# Patient Record
Sex: Female | Born: 1975 | Race: Black or African American | Hispanic: No | Smoking: Current every day smoker
Health system: Southern US, Community
[De-identification: ages and names within clinical notes are randomized; demographics above are authoritative.]

## PROBLEM LIST (undated history)

## (undated) DIAGNOSIS — I10 Essential (primary) hypertension: Secondary | ICD-10-CM

## (undated) HISTORY — PX: WISDOM TOOTH EXTRACTION: SHX21

---

## 2010-06-29 DIAGNOSIS — I10 Essential (primary) hypertension: Secondary | ICD-10-CM

## 2010-06-29 HISTORY — DX: Essential (primary) hypertension: I10

## 2015-12-13 ENCOUNTER — Other Ambulatory Visit: Payer: Self-pay | Admitting: Obstetrics and Gynecology

## 2015-12-13 DIAGNOSIS — Z1231 Encounter for screening mammogram for malignant neoplasm of breast: Secondary | ICD-10-CM

## 2015-12-26 ENCOUNTER — Ambulatory Visit (HOSPITAL_COMMUNITY)
Admission: RE | Admit: 2015-12-26 | Discharge: 2015-12-26 | Disposition: A | Discharge status: Home / Self Care | Payer: No Typology Code available for payment source | Source: Ambulatory Visit | Attending: Obstetrics and Gynecology | Admitting: Obstetrics and Gynecology

## 2015-12-26 ENCOUNTER — Encounter (HOSPITAL_COMMUNITY): Payer: Self-pay

## 2015-12-26 ENCOUNTER — Ambulatory Visit
Admission: RE | Admit: 2015-12-26 | Discharge: 2015-12-26 | Disposition: A | Discharge status: Home / Self Care | Payer: No Typology Code available for payment source | Source: Ambulatory Visit | Attending: Obstetrics and Gynecology | Admitting: Obstetrics and Gynecology

## 2015-12-26 VITALS — BP 142/96 | Temp 98.7°F | Ht 65.0 in | Wt 280.8 lb

## 2015-12-26 DIAGNOSIS — Z1231 Encounter for screening mammogram for malignant neoplasm of breast: Secondary | ICD-10-CM

## 2015-12-26 DIAGNOSIS — Z01419 Encounter for gynecological examination (general) (routine) without abnormal findings: Secondary | ICD-10-CM

## 2015-12-26 HISTORY — DX: Essential (primary) hypertension: I10
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
## 2015-12-26 NOTE — Patient Instructions (Signed)
Educational materials on self breast awareness given. Explained to Janet Wise that [facility] will cover Pap smears every 3 years unless has a history of abnormal Pap smears. [facility] mamm[facility]. Appointment scheduled for Thursday, December 26, 2015 at 1120. Let patient know will follow up with her within the next couple weeks with results of Pap smear by phone. Let patient know that the [facility] will follow up with her on results to mammogram by letter or phone. Janet DunningsJamie Harris verbalized understanding.  [place], Kathaleen Maserhristine Poll, RN 1:46 PM

## 2015-12-27 LAB — CYTOLOGY - PAP

## 2015-12-30 ENCOUNTER — Encounter (HOSPITAL_COMMUNITY): Payer: Self-pay | Admitting: *Deleted

## 2015-12-30 ENCOUNTER — Telehealth (HOSPITAL_COMMUNITY): Payer: Self-pay | Admitting: *Deleted

## 2015-12-30 NOTE — Telephone Encounter (Signed)
Telephoned patient at home # and discussed negative pap smear results. HPV was negative. Transformation zone absent due to this next pap smear due in 3 years. Patient voiced understanding.

## 2016-02-03 ENCOUNTER — Telehealth: Payer: Self-pay

## 2016-02-03 NOTE — Telephone Encounter (Signed)
Called and informed about WISEWOMAN Program. Patient stated that she was interested and scheduled her for 02/06/16 at 9:30 AM

## 2016-02-05 ENCOUNTER — Telehealth: Payer: Self-pay

## 2016-02-05 NOTE — Telephone Encounter (Signed)
Patient was called to remind of WISEWOMAN appointment at 9:30 and to come fasting. Patient stated that would be there and did not need my phone number.

## 2016-02-06 ENCOUNTER — Other Ambulatory Visit: Payer: Self-pay

## 2016-02-06 ENCOUNTER — Ambulatory Visit: Payer: Self-pay

## 2016-02-06 VITALS — BP 146/102 | HR 72 | Temp 98.2°F | Resp 16 | Ht 62.6 in | Wt 278.7 lb

## 2016-02-06 DIAGNOSIS — Z0000 Encounter for general adult medical examination without abnormal findings: Secondary | ICD-10-CM

## 2016-02-06 NOTE — Progress Notes (Signed)
[facility] and is currently a [facility] patient effective 12/26/2015.   Clinical Measurements: Patient is 5 ft. 2.6 inches, weight 278.7 lbs, and BMI 50.1.   Medical History: Patient has no history of high cholesterol or diabetes. Patient does have a history of hypertension. Patient stated that was on medication in blue box believe it was lisinopril/HCTZ. Per patient no diagnosed history of coronary heart disease, heart attack, heart failure, stroke/TIA, vascular disease or congenital heart defects.  Blood Pressure, Self-measurement: Patient states does not check Blood pressure. Explained with her history that should check blood pressure at least weekly.  Nutrition Assessment: Patient stated that eats 1 fruit a week. Patient states she eats 5 servings of vegetables a week. Per patient does not eat 3 or more ounces of whole grains daily. Patient does eat two or more servings of fish weekly. Patient states she does not drink more than 36 ounces or 450 calories of beverages with added sugars weekly. Patient states she drinks a lot of water. Patient stated she does watch her salt intake.   Physical Activity Assessment: Patient stated she moves around a lot cleaning and walking in a day. Patient stated has probably been doing 2400 minutes of moderate exercise a week and no vigorous exercise.  Smoking Status: Patient stated that had quit smoking for three years and started back. Patient states smokes 1/2 pack newports per day and is not exposed to smoke.  Quality of Life Assessment: In assessing patient's physical quality of life she stated that out of the past 30 days that she has felt her health was good all days. Patient also stated that in the past 30 days that her mental health was good including stress, depression and problems with emotions for all days. Patient did state that out of the past 30 days she felt her physical or mental health had not kept her from doing  her usual activities including self-care, work or recreation.  Plan: Lab work will be done today including a lipid panel and Hgb A1C. Will call lab results when they are finished. Patient will receive Health Coaching for risk reduction initially and then as patient wants.  FIRST HEALTH COACH: During initial screening  wellness health coaching was started.  Blood Pressure: Blood pressure readings were 160/108 and 146/102. Readings were in the hypertensive area. Informed patient that I would be getting her an appointment in the next week. I found out what time was better for appointment and will have patient get paper work for doctor's visit. Discussed non changeable and changeable risk factors with patient. Discussed changeable risk factors specific to patient. These included overweight, smoking and stress.  Nutrition: Discussed with patient that normally should eat 2 servings of fruits and 2 to 21/2 of vegetables a day with a serving being 1/2 cup or small piece. Informed patient that two servings of fish were recommended per week, being ones with omega threes. Listed the fishes with omega three's for patient.Discussed that should have 5 to 7 ounces of fiber a day. Explained to patient that should eat fruits and vegetables high in fiber but 3 of her 5 to 7 grains of fiber should be whole grain. Examples of whole grains were given. Informed about sugar which patient is doing. Discussed decreasing salt intake by mainly adding some while cook and none at table.  Physical Activity:  Patient was Informed that minimal moderate activity is 150 minutes a week or 75 minutes of  vigorous. Patient is doing her minimal moderate exercise. Discussed adding in some flexeibily and balance exercises.  Patient and I talked about that patients intake of nutrients may not be sufficient.  PLAN: Will set goal when call lab results and do second health coaching. Will think about what goals may want to set.

## 2016-02-06 NOTE — Patient Instructions (Signed)
Discussed health assessment with patient. Informed patient that she would need to be followed up for blood pressure. She will be called with results of lab work and we will then discussed any further follow up the patient needs. Patient will implement behavior modifications to help lower BP. Patient verbalized understanding. 

## 2016-02-07 ENCOUNTER — Telehealth: Payer: Self-pay

## 2016-02-07 LAB — LIPID PANEL
CHOLESTEROL TOTAL: 155 mg/dL (ref 100–199)
Chol/HDL Ratio: 3.2 ratio units (ref 0.0–4.4)
HDL: 48 mg/dL (ref >39)
LDL CALC: 93 mg/dL (ref 0–99)
TRIGLYCERIDES: 70 mg/dL (ref 0–149)
VLDL CHOLESTEROL CAL: 14 mg/dL (ref 5–40)

## 2016-02-07 LAB — HEMOGLOBIN A1C
ESTIMATED AVERAGE GLUCOSE: 91 mg/dL
Hemoglobin A1c: 4.8 % (ref 4.8–5.6)

## 2016-02-07 NOTE — Telephone Encounter (Addendum)
LAB RESULTS: Patient was called to inform about lab work from 02/06/16. I informed patient: cholesterol- 155, HDL- 48, LDL- 93, triglycerides - 70,  HBG-A1C - 4.8, and BMI 50.1.  DOCTOR's APPOINTMENT: [facility], August [facility] at 10:15 AM for elevated blood Pressure. Facility address and telephone number given. Patient was informed not to let doctor do any other lab work or injections. WISEWOMAN does not cover and patient would be billed.   HEALTH COACHING: Did risk reduction counseling/Heach Coach concerning related to BMI/Weight and blood pressure. Patient and I discussed her eating. Patient  Stated that does not eat breakfast or eat a lot. Patient asked about juicing and we discussed pros and con's.  Discussed serving sizes and reminded her of her WISEWOMAN overview sheet that received at screening. WISEWOMAN sheet has all food group minimal servings required for day for1600 calorie diet.  Some examples of meal were given. Discussed 3 regular meals or 6 small meals a day.   NAVIGATION NEEDS ASSESSMENT AND CARE PLAN: Patient does need additional social support and has a lack of access to services. Patient has to get transportation to get to appointments. Patient understands why she has to be followed up for blood pressure. Only barrier may have been that patient is not aware of some services to assist the low income. Patient has been directed to [facility] for [facility].  PLAN: Patient will attend doctor's appointment. Will come by and pick up packets for doctors office. Will call patient back after appointment to see how appointment went. Patient and I will discuss more health coaching concerning patients exercise and weight loss goals

## 2016-02-10 NOTE — Progress Notes (Signed)
A packet was left at front desk for patient for her up coming doctors appointment on Friday. Patient stated that would pick up Monday before leaves town.

## 2016-02-14 ENCOUNTER — Ambulatory Visit (INDEPENDENT_AMBULATORY_CARE_PROVIDER_SITE_OTHER): Payer: Self-pay | Admitting: Family Medicine

## 2016-02-14 DIAGNOSIS — Z716 Tobacco abuse counseling: Secondary | ICD-10-CM

## 2016-02-14 DIAGNOSIS — I10 Essential (primary) hypertension: Secondary | ICD-10-CM

## 2016-02-14 NOTE — Progress Notes (Signed)
Subjective:    Patient ID: Janet Wise , female   DOB: 08/23/1975 , 40 y.o..   MRN: [id number]  HPI  Janet DunningsJamie Wise [facility] and to establish care at the clinic.  Hypertension Blood pressure at home: Does not check, but was told she had high blood pressure in the past. Exercise: Cleans and walks throughout the day but no structured exercise regimen in place Low salt diet: not adherent  Medications: None currently. Has previously taken lisinopril and HCTZ ROS: Denies headache, dizziness, visual changes, nausea, vomiting, chest pain, abdominal pain or shortness of breath. BP Readings from Last 3 Encounters:  02/14/16 140/80  02/06/16 (!) 146/102  12/26/15 (!) 142/96    Tobacco Abuse:  She  has been smoking ~12 cigarettes per day off and on since she was 15. There has been multiple attempts to quit and a couple successes. Previous strategies tried were "cold [place]key". She is ready to quit in the next couple days.   Review of Systems: Per HPI. All other systems reviewed and are negative.  Health Maintenance:  Mammogram in June 2017- normal  Pap smear in June 2017- normal   Past Medical History:  Diagnosis Date  . Hypertension 2012   Medications:None  Social Hx: Smokes cigarettes. Smokes 1/2 PPD. Has been smoking off and on since she was 40 years old Lives with her 42 year old son She is currently unemployed    Objective:   BP 140/80   Pulse 70   Temp 98.7 F (37.1 C) (Oral)   Ht 5\' 3"  (1.6 m)   Wt 276 lb 12.8 oz (125.6 kg)   LMP 02/14/2016   SpO2 99%   BMI 49.03 kg/m  Physical Exam  Gen: Pleasant, NAD, alert, cooperative with exam, well-appearing, obese HEENT: NCAT, PERRL, clear conjunctiva, oropharynx clear, supple neck  Cardiac: Regular rate and rhythm, normal S1/S2, no murmur, no edema, capillary refill brisk  Respiratory: Clear to auscultation bilaterally, no wheezes, non-labored Gastrointestinal: soft, non tender, non distended,  bowel sounds present Skin: no rashes, normal turgor  Neurological: no gross deficits.  Psych: good insight, normal mood and affect   Assessment & Plan:  Essential hypertension Goal BP for patient is less than 140/90 according to JNC-8 guidelines. Suspect she has high BP due to obesity, tobacco abuse, and current diet. BP initially 149/96 in beginning of the office visit, patient admitted she was a little nervoous. When rechecked manually later it was 140/80. Patient very well informed about risks of hypertension and was agreeable to start a medication if needed. Patient does not have any financial coverage for lab work at this time, she would like to first obtain financial assistance before getting any lab work done. The plan is for her to meet with financial counselor next week and have her come back afterwards and recheck her BP and get a CMP. If her BP is still above goal and her kidney function and electrolytes normal, may start HCTZ.   Tobacco abuse counseling Smoking cessation instruction/counseling given:  counseled patient on the dangers of tobacco use, advised patient to stop smoking, and reviewed strategies to maximize success. Discussed medications that could assist with smoking cessation. Patient agreeable to try Nicotine patch. Will start with 14mg  patch as she is currently smoking a 1/2 PPD. I suspect that if she is able to quit smoking, her BP may also reach its goal.    Anders Simmondshristina Gambino, MD [facility], PGY-2

## 2016-02-14 NOTE — Patient Instructions (Signed)
Thank you for coming in today, it was so nice to see you! Today we talked about:    Blood pressure: Your blood pressure was slightly high today. We will wait for you to have coverage for your visit to do lab work before we start any medications. Your blood pressure may go down on its own after you quit smoking and we will not need any medications. Please stop by the front desk and ask for instruction on seeing [facility] for financial assistance.   Cigarette smoking: I have prescribed nicotine patches, they will be sent to your pharmacy. Please use after you quit smoking. If you need extra help 1800 QUIT NOW for free resources.  Please follow up in 2-3 weeks. You can schedule this appointment at the front desk before you leave or call the clinic.  If you have any questions or concerns, please do not hesitate to call the office at [phone]. You can also message me directly via MyChart.   Sincerely,  Anders Simmondshristina Kimmy Parish, MD

## 2016-02-16 ENCOUNTER — Encounter: Payer: Self-pay | Admitting: Family Medicine

## 2016-02-16 DIAGNOSIS — I10 Essential (primary) hypertension: Secondary | ICD-10-CM | POA: Insufficient documentation

## 2016-02-16 DIAGNOSIS — Z716 Tobacco abuse counseling: Secondary | ICD-10-CM | POA: Insufficient documentation

## 2016-02-16 MED ORDER — NICOTINE 14 MG/24HR TD PT24: 14.0000 mg | MEDICATED_PATCH | Freq: Every day | TRANSDERMAL | 4 refills | Status: DC

## 2016-02-16 NOTE — Assessment & Plan Note (Addendum)
Smoking cessation instruction/counseling given:  counseled patient on the dangers of tobacco use, advised patient to stop smoking, and reviewed strategies to maximize success. Discussed medications that could assist with smoking cessation. Patient agreeable to try Nicotine patch. Will start with 14mg  patch as she is currently smoking a 1/2 PPD. I suspect that if she is able to quit smoking, her BP may also reach its goal.

## 2016-02-16 NOTE — Assessment & Plan Note (Addendum)
Goal BP for patient is less than 140/90 according to JNC-8 guidelines. Suspect she has high BP due to obesity, tobacco abuse, and current diet. BP initially 149/96 in beginning of the office visit, patient admitted she was a little nervoous. When rechecked manually later it was 140/80. Patient very well informed about risks of hypertension and was agreeable to start a medication if needed. Patient does not have any financial coverage for lab work at this time, she would like to first obtain financial assistance before getting any lab work done. The plan is for her to meet with financial counselor next week and have her come back afterwards and recheck her BP and get a CMP. If her BP is still above goal and her kidney function and electrolytes normal, may start HCTZ.

## 2016-02-19 ENCOUNTER — Ambulatory Visit: Payer: No Typology Code available for payment source

## 2016-02-26 ENCOUNTER — Ambulatory Visit: Payer: Self-pay | Attending: Internal Medicine

## 2016-03-18 ENCOUNTER — Telehealth: Payer: Self-pay | Admitting: Family Medicine

## 2016-03-18 ENCOUNTER — Ambulatory Visit (INDEPENDENT_AMBULATORY_CARE_PROVIDER_SITE_OTHER): Payer: Self-pay | Admitting: Family Medicine

## 2016-03-18 VITALS — BP 147/93 | HR 74 | Temp 98.7°F | Ht 65.0 in | Wt 279.8 lb

## 2016-03-18 DIAGNOSIS — Z0000 Encounter for general adult medical examination without abnormal findings: Secondary | ICD-10-CM

## 2016-03-18 DIAGNOSIS — N938 Other specified abnormal uterine and vaginal bleeding: Secondary | ICD-10-CM

## 2016-03-18 DIAGNOSIS — I10 Essential (primary) hypertension: Secondary | ICD-10-CM

## 2016-03-18 DIAGNOSIS — Z716 Tobacco abuse counseling: Secondary | ICD-10-CM

## 2016-03-18 MED ORDER — HYDROCHLOROTHIAZIDE 12.5 MG PO TABS: 12.5000 mg | ORAL_TABLET | Freq: Every day | ORAL | 3 refills | Status: DC

## 2016-03-18 NOTE — Assessment & Plan Note (Signed)
Smoking about 1 pack per day, sometimes a half pack per day. Establish quit date of 06/29/2016. Patient not interested in any nicotine replacement at this time.

## 2016-03-18 NOTE — Assessment & Plan Note (Addendum)
Symptoms could likely be from fibroids. Will need to rule out more serious causes including malignancy. Pap smear reassuringly normal in June 2017. Also concerned that patient may be anemic based on the amount of blood that she loses every month. Patient not a candidate for oral contraceptives because she is a smoker over the age of 42, which increases her risk of blood clots. -Ordered CBC, TSH, pelvic and transvaginal ultrasound today -May consider IUD to decrease menstrual bleeding if pelvic ultrasounds normal -May consider referral to gynecology in the future

## 2016-03-18 NOTE — Patient Instructions (Signed)
Thank you for coming in today, it was so nice to see you! Today we talked about:    Blood pressure: We will start you on hydrochlorothiazide. Take 1 pill a day. Try to check your blood pressure at least 3 times a week. The top number should be less than 140 and the bottom number should be less than 90.   Smoking: Your quit date is Jun 29, 2016 :) . Call 1800 QUIT NOW for more assistance on smoking cessation  Heavy menstrual bleeding: We will check your blood today and I have ordered an ultrasound  Please follow up in 3 months. You can schedule this appointment at the front desk before you leave or call the clinic.  Bring in all your medications or supplements to each appointment for review.   If we ordered any tests today, you will be notified via telephone of any abnormalities. If everything is normal you will get a letter in the mail.   If you have any questions or concerns, please do not hesitate to call the office at [phone]. You can also message me directly via MyChart.   Sincerely,  Anders Simmondshristina Athalene Kolle, MD   Hypertension Hypertension, commonly called high blood pressure, is when the force of blood pumping through your arteries is too strong. Your arteries are the blood vessels that carry blood from your heart throughout your body. A blood pressure reading consists of a higher number over a lower number, such as 110/72. The higher number (systolic) is the pressure inside your arteries when your heart pumps. The lower number (diastolic) is the pressure inside your arteries when your heart relaxes. Ideally you want your blood pressure below 120/80. Hypertension forces your heart to work harder to pump blood. Your arteries may become narrow or stiff. Having untreated or uncontrolled hypertension can cause heart attack, stroke, kidney disease, and other problems. RISK FACTORS Some risk factors for high blood pressure are controllable. Others are not.  Risk factors you cannot control  include:   Race. You may be at higher risk if you are African American.  Age. Risk increases with age.  Gender. Men are at higher risk than women before age 42 years. After age 42, women are at higher risk than men. Risk factors you can control include:  Not getting enough exercise or physical activity.  Being overweight.  Getting too much fat, sugar, calories, or salt in your diet.  Drinking too much alcohol. SIGNS AND SYMPTOMS Hypertension does not usually cause signs or symptoms. Extremely high blood pressure (hypertensive crisis) may cause headache, anxiety, shortness of breath, and nosebleed. DIAGNOSIS To check if you have hypertension, your health care provider will measure your blood pressure while you are seated, with your arm held at the level of your heart. It should be measured at least twice using the same arm. Certain conditions can cause a difference in blood pressure between your right and left arms. A blood pressure reading that is higher than normal on one occasion does not mean that you need treatment. If it is not clear whether you have high blood pressure, you may be asked to return on a different day to have your blood pressure checked again. Or, you may be asked to monitor your blood pressure at home for 1 or more weeks. TREATMENT Treating high blood pressure includes making lifestyle changes and possibly taking medicine. Living a healthy lifestyle can help lower high blood pressure. You may need to change some of your habits. Lifestyle changes  may include:  Following the DASH diet. This diet is high in fruits, vegetables, and whole grains. It is low in salt, red meat, and added sugars.  Keep your sodium intake below 2,300 mg per day.  Getting at least 30-45 minutes of aerobic exercise at least 4 times per week.  Losing weight if necessary.  Not smoking.  Limiting alcoholic beverages.  Learning ways to reduce stress. Your health care provider may prescribe  medicine if lifestyle changes are not enough to get your blood pressure under control, and if one of the following is true:  You are 61-80 years of age and your systolic blood pressure is above 140.  You are 2 years of age or older, and your systolic blood pressure is above 150.  Your diastolic blood pressure is above 90.  You have diabetes, and your systolic blood pressure is over 140 or your diastolic blood pressure is over 90.  You have kidney disease and your blood pressure is above 140/90.  You have heart disease and your blood pressure is above 140/90. Your personal target blood pressure may vary depending on your medical conditions, your age, and other factors. HOME CARE INSTRUCTIONS  Have your blood pressure rechecked as directed by your health care provider.   Take medicines only as directed by your health care provider. Follow the directions carefully. Blood pressure medicines must be taken as prescribed. The medicine does not work as well when you skip doses. Skipping doses also puts you at risk for problems.  Do not smoke.   Monitor your blood pressure at home as directed by your health care provider. SEEK MEDICAL CARE IF:   You think you are having a reaction to medicines taken.  You have recurrent headaches or feel dizzy.  You have swelling in your ankles.  You have trouble with your vision. SEEK IMMEDIATE MEDICAL CARE IF:  You develop a severe headache or confusion.  You have unusual weakness, numbness, or feel faint.  You have severe chest or abdominal pain.  You vomit repeatedly.  You have trouble breathing. MAKE SURE YOU:   Understand these instructions.  Will watch your condition.  Will get help right away if you are not doing well or get worse.   This information is not intended to replace advice given to you by your health care provider. Make sure you discuss any questions you have with your health care provider.   Document Released:  06/15/2005 Document Revised: 10/30/2014 Document Reviewed: 04/07/2013 Elsevier Interactive Patient Education [employer]

## 2016-03-18 NOTE — Telephone Encounter (Signed)
Pt called and said that her BO medication has to be faxed to the [facility]. jw

## 2016-03-18 NOTE — Telephone Encounter (Signed)
Called to confirm that patient had gotten her appointment time for her Ultrasound on 27 Sept 2017 at [facility] 0900 hrs with a show time of 0845.  She was instructed to arrive with a full bladder.Glennie HawkSimpson, Shamra Bradeen R

## 2016-03-18 NOTE — Assessment & Plan Note (Signed)
Uncontrolled. Patient has not checked her blood pressure at home but suspect that she lives in the 140s systolically and 90s diastolically. -Obtain CMP today -We will start hydrochlorothiazide 12.5 mg daily -Encouraged smoking cessation and weight loss

## 2016-03-18 NOTE — Assessment & Plan Note (Signed)
Obtained HIV testing today. Patient not interested in flu shot.

## 2016-03-18 NOTE — Progress Notes (Signed)
Subjective:    Patient ID: Janet DunningsJamie Wise , female   DOB: 07/30/1975 , 40 y.o..   MRN: [id number]  HPI  Janet DunningsJamie Wise is here for  Chief Complaint  Patient presents with  . Follow-up    blood pressure  . painful menses    large clots   .  Hypertension Blood pressure at home: Does not check Exercise: None Low salt diet: No Medications: No medications currently ROS: Denies headache, dizziness, visual changes, nausea, vomiting, chest pain, abdominal pain or shortness of breath. BP Readings from Last 3 Encounters:  03/18/16 (!) 147/93  02/14/16 140/80  02/06/16 (!) 146/102    Tobacco Abuse:  Patient continues to smoke about 1 PPD, sometimes she will only smoke 10 cigarettes a day. She states the Nicotine patches are just not for her. She does not want to try the lozenges or nicotine gum either. She thinks that the only way that she is going to quit is that she just quit cold [place]. Discussed a quit date and she decided on 06/29/2016.  Large Blood clots and painful menses:  Patient states that she has had large blood clots with her menstrual cycle for the last couple years. She gets her menstrual cycle once a month and it usually lasts about 5 days. During her heaviest days she will pass large clots and can use up to 10 tampons a day. Denies any history of anemia, fatigue, lightheadedness. Admits that she does like to eat a lot of ice.  Review of Systems: Per HPI. All other systems reviewed and are negative.  Past Medical History: Patient Active Problem List   Diagnosis Date Noted  . Dysfunctional uterine bleeding 03/18/2016  . Healthcare maintenance 03/18/2016  . Essential hypertension 02/16/2016  . Tobacco abuse counseling 02/16/2016    Medications: None  Social Hx:  reports that she has been smoking Cigarettes.  She has a 1.00 pack-year smoking history. She has never used smokeless tobacco.    Objective:   BP (!) 147/93 (BP Location: Left Arm, Patient Position:  Sitting, Cuff Size: Large)   Pulse 74   Temp 98.7 F (37.1 C) (Oral)   Ht 5\' 5"  (1.651 m)   Wt 279 lb 12.8 oz (126.9 kg)   SpO2 98%   BMI 46.56 kg/m  Physical Exam  Gen: NAD, alert, cooperative with exam, well-appearing, obese HEENT: NCAT, PERRL, clear conjunctiva, oropharynx clear, supple neck Cardiac: Regular rate and rhythm, normal S1/S2, no murmur, no edema, capillary refill brisk  Respiratory: Clear to auscultation bilaterally, no wheezes, non-labored breathing Gastrointestinal: soft, non tender, non distended, bowel sounds present Skin: no rashes, normal turgor  Neurological: no gross deficits.  Psych: good insight, normal mood and affect   Assessment & Plan:  Dysfunctional uterine bleeding Symptoms could likely be from fibroids. Will need to rule out more serious causes including malignancy. Pap smear reassuringly normal in June 2017. Also concerned that patient may be anemic based on the amount of blood that she loses every month. Patient not a candidate for oral contraceptives because she is a smoker over the age of 935, which increases her risk of blood clots. -Ordered CBC, TSH, pelvic and transvaginal ultrasound today -May consider IUD to decrease menstrual bleeding if pelvic ultrasounds normal -May consider referral to gynecology in the future   Essential hypertension Uncontrolled. Patient has not checked her blood pressure at home but suspect that she lives in the 140s systolically and 90s diastolically. -Obtain CMP today -We will start hydrochlorothiazide 12.5  mg daily -Encouraged smoking cessation and weight loss  Tobacco abuse counseling Smoking about 1 pack per day, sometimes a half pack per day. Establish quit date of 06/29/2016. Patient not interested in any nicotine replacement at this time.  Healthcare maintenance Obtained HIV testing today. Patient not interested in flu shot.   Anders Simmonds, MD [facility], PGY-2

## 2016-03-19 ENCOUNTER — Telehealth: Payer: Self-pay | Admitting: Family Medicine

## 2016-03-19 DIAGNOSIS — R7989 Other specified abnormal findings of blood chemistry: Secondary | ICD-10-CM

## 2016-03-19 LAB — CBC
HEMATOCRIT: 34.8 % — AB (ref 35.0–45.0)
HEMOGLOBIN: 10.3 g/dL — AB (ref 11.7–15.5)
MCH: 24.9 pg — ABNORMAL LOW (ref 27.0–33.0)
MCHC: 29.6 g/dL — ABNORMAL LOW (ref 32.0–36.0)
MCV: 84.1 fL (ref 80.0–100.0)
MPV: 8.6 fL (ref 7.5–12.5)
Platelets: 395 10*3/uL (ref 140–400)
RBC: 4.14 MIL/uL (ref 3.80–5.10)
RDW: 18.8 % — ABNORMAL HIGH (ref 11.0–15.0)
WBC: 7.1 10*3/uL (ref 3.8–10.8)

## 2016-03-19 LAB — COMPLETE METABOLIC PANEL WITH GFR
ALBUMIN: 3.8 g/dL (ref 3.6–5.1)
ALT: 6 U/L (ref 6–29)
AST: 11 U/L (ref 10–30)
Alkaline Phosphatase: 59 U/L (ref 33–115)
BUN: 8 mg/dL (ref 7–25)
CALCIUM: 9.1 mg/dL (ref 8.6–10.2)
CHLORIDE: 102 mmol/L (ref 98–110)
CO2: 25 mmol/L (ref 20–31)
Creat: 0.78 mg/dL (ref 0.50–1.10)
GFR, Est African American: >89 mL/min (ref >=60)
GFR, Est Non African American: >89 mL/min (ref >=60)
Glucose, Bld: 92 mg/dL (ref 65–99)
POTASSIUM: 4.3 mmol/L (ref 3.5–5.3)
Sodium: 135 mmol/L (ref 135–146)
Total Bilirubin: 0.2 mg/dL (ref 0.2–1.2)
Total Protein: 7.5 g/dL (ref 6.1–8.1)

## 2016-03-19 LAB — HIV ANTIBODY (ROUTINE TESTING W REFLEX): HIV: NONREACTIVE

## 2016-03-19 LAB — TSH: TSH: 0.37 mIU/L — ABNORMAL LOW

## 2016-03-19 NOTE — Telephone Encounter (Signed)
Called patient to discuss her lab work. Her TSH was slightly low at 0.37 pointing to possible hyperthyroidism. Patient has no hyperthyroidism symptoms, we will repeat the TSH and well as T4. Discussed with patient that I will place an order for the lab and she can come in anytime, just call the clinic 24 hours in advance. Also discussed low hemoglobin at 10.3 which was expected with patient's heavy menstrual cycles. Advised taking daily iron supplement and will follow up after pelvic and transvaginal ultrasounds.

## 2016-03-23 ENCOUNTER — Telehealth: Payer: Self-pay | Admitting: Family Medicine

## 2016-03-23 MED ORDER — HYDROCHLOROTHIAZIDE 12.5 MG PO TABS: 12.5000 mg | ORAL_TABLET | Freq: Every day | ORAL | 3 refills | Status: DC

## 2016-03-23 NOTE — Telephone Encounter (Signed)
Pt stated a Rx was supposed to called in to the health department last Wednesday. Pt went to pick it up today and it wasn't there. Pt would like to be called after it has been called in so pt knows its there. Please advise. Thanks! ep

## 2016-03-23 NOTE — Telephone Encounter (Signed)
Left message on [facility] line for HCTZ 12.5 mg and also electronically faxed to health department.  Patient is aware.  Clovis PuMartin, Fahd Galea L, RN

## 2016-03-25 ENCOUNTER — Ambulatory Visit (HOSPITAL_COMMUNITY)
Admission: RE | Admit: 2016-03-25 | Discharge: 2016-03-25 | Disposition: A | Discharge status: Home / Self Care | Payer: Self-pay | Source: Ambulatory Visit | Attending: Family Medicine | Admitting: Family Medicine

## 2016-03-25 ENCOUNTER — Other Ambulatory Visit: Payer: Self-pay

## 2016-03-25 DIAGNOSIS — R938 Abnormal findings on diagnostic imaging of other specified body structures: Secondary | ICD-10-CM | POA: Insufficient documentation

## 2016-03-25 DIAGNOSIS — R7989 Other specified abnormal findings of blood chemistry: Secondary | ICD-10-CM

## 2016-03-25 DIAGNOSIS — N938 Other specified abnormal uterine and vaginal bleeding: Secondary | ICD-10-CM | POA: Insufficient documentation

## 2016-03-25 LAB — T4, FREE: FREE T4: 0.9 ng/dL (ref 0.8–1.8)

## 2016-03-25 LAB — TSH: TSH: 0.67 m[IU]/L

## 2016-04-02 ENCOUNTER — Telehealth: Payer: Self-pay | Admitting: Family Medicine

## 2016-04-02 DIAGNOSIS — N938 Other specified abnormal uterine and vaginal bleeding: Secondary | ICD-10-CM

## 2016-04-02 DIAGNOSIS — Z0120 Encounter for dental examination and cleaning without abnormal findings: Secondary | ICD-10-CM

## 2016-04-02 NOTE — Telephone Encounter (Signed)
[facility] She has an orange card that expires 05-28-16. She would like the results from her blood work and ultrasound.

## 2016-04-02 NOTE — Telephone Encounter (Signed)
Pt states the pharmacy did not have the correct dosage of medication, so they gave pt a higher dose and told her to take half. Pt is experiencing a lot of vaginal itching and believes it is being caused by the medication. Please advise. Thanks! ep

## 2016-04-06 NOTE — Telephone Encounter (Signed)
Called patient back. She has been taking HCTZ and states it is making her vagina itch. She hasn't taken them in the last couple days. Her vagina is still itching however. Discussed with patient that this medication typically does not cause those symptoms. I advised her to make an appointment with us to discuss vaginal itching. Patient will look at her [place] and[place] call back during business hours. I advised her to please resume her HCTZ for blood pressure  I also discussed patient's vaginal U/S results. I believe she would benefit from seeing gynecology, will place a referral.   Additionally, patient states that [facility] needs [facility]. Unclear how to place a referral to the dentist. Will send a message to our CSW Gavin PoundDeborah for advice on this.   Anders Simmondshristina Mithcell Schumpert, MD [facility], PGY-2

## 2016-04-13 NOTE — Addendum Note (Signed)
Addended by: Beaulah DinningGAMBINO, Dani Danis M on: 04/13/2016 01:00 PM   Modules accepted: Orders

## 2016-06-04 ENCOUNTER — Encounter: Payer: Self-pay | Admitting: Family

## 2016-06-09 ENCOUNTER — Ambulatory Visit: Payer: Self-pay | Admitting: Family Medicine

## 2016-08-17 ENCOUNTER — Telehealth: Payer: Self-pay

## 2016-09-18 NOTE — Telephone Encounter (Signed)
Health Coaching # 3  Initial Screening: 02/06/16  Goal: Patients goal was to make sure she was getting her calorie intake in. She states she has started eating breakfast recently, she she did not before. She will work on eating lunch and does eat dinner. She will work on portion control and getting in the amount of fruit, vegetable, and calorie intake daily.   Navigation: Time spent with patient via phone: 10 Minutes, patient understands there will be a final follow up call in 4- 6 weeks.  She would like to renew her wisewoman when it is time.

## 2016-10-15 ENCOUNTER — Encounter (HOSPITAL_COMMUNITY): Payer: Self-pay | Admitting: Family Medicine

## 2016-10-15 ENCOUNTER — Ambulatory Visit (HOSPITAL_COMMUNITY)
Admission: EM | Admit: 2016-10-15 | Discharge: 2016-10-15 | Disposition: A | Discharge status: Home / Self Care | Payer: Self-pay | Attending: Family Medicine | Admitting: Family Medicine

## 2016-10-15 DIAGNOSIS — M79604 Pain in right leg: Secondary | ICD-10-CM

## 2016-10-15 DIAGNOSIS — M7651 Patellar tendinitis, right knee: Secondary | ICD-10-CM

## 2016-10-15 MED ORDER — KETOROLAC TROMETHAMINE 60 MG/2ML IM SOLN
60.0000 mg | Freq: Once | INTRAMUSCULAR | Status: AC
  Administered 2016-10-15: 60 mg via INTRAMUSCULAR

## 2016-10-15 MED ORDER — NAPROXEN 500 MG PO TABS: 500.0000 mg | ORAL_TABLET | Freq: Two times a day (BID) | ORAL | 0 refills | Status: DC

## 2016-10-15 MED ORDER — KETOROLAC TROMETHAMINE 60 MG/2ML IM SOLN
INTRAMUSCULAR | Status: AC
  Filled 2016-10-15: qty 2

## 2016-10-15 NOTE — ED Provider Notes (Signed)
CSN: [id number]     Arrival date & time 10/15/16  1834 History   None    Chief Complaint  Patient presents with  . Leg Pain   (Consider location/radiation/quality/duration/timing/severity/associated sxs/prior Treatment) Patient c/o right leg pain after playing sports 6 days ago.   The history is provided by the patient.  Leg Pain  Location:  Knee Injury: no   Knee location:  R knee Pain details:    Quality:  Aching   Radiates to:  Does not radiate   Onset quality:  Sudden   Duration:  6 days   Timing:  Constant Chronicity:  New   Past Medical History:  Diagnosis Date  . Hypertension 2012   History reviewed. No pertinent surgical history. Family History  Problem Relation Age of Onset  . Diabetes Maternal Grandmother   . Hypertension Maternal Grandmother   . Diabetes Paternal Grandmother   . Hypertension Paternal Grandmother    Social History  Substance Use Topics  . Smoking status: Current Every Day Smoker    Packs/day: 0.25    Years: 4.00    Types: Cigarettes  . Smokeless tobacco: Never Used  . Alcohol use No   OB History    Gravida Para Term Preterm AB Living   SAB TAB Ectopic Multiple Live Births   2             Review of Systems  Constitutional: Negative.   HENT: Negative.   Eyes: Negative.   Respiratory: Negative.   Cardiovascular: Negative.   Gastrointestinal: Negative.   Endocrine: Negative.   Genitourinary: Negative.   Musculoskeletal: Positive for arthralgias.  Allergic/Immunologic: Negative.   Neurological: Negative.   Hematological: Negative.   Psychiatric/Behavioral: Negative.     Allergies  Patient has no known allergies.  Home Medications   Prior to Admission medications   Medication Sig Start Date End Date Taking? Authorizing Provider  hydrochlorothiazide (HYDRODIURIL) 12.5 MG tablet Take 1 tablet (12.5 mg total) by mouth daily. 03/23/16   Beaulah Dinning, MD  naproxen (NAPROSYN) 500 MG tablet Take 1 tablet  (500 mg total) by mouth 2 (two) times daily with a meal. 10/15/16   Deatra Canter, FNP   Meds Ordered and Administered this Visit   Medications  ketorolac (TORADOL) injection 60 mg (60 mg Intramuscular Given 10/15/16 1909)    BP (!) 160/97   Pulse (!) 113   Temp 98.5 F (36.9 C)   Resp 18   SpO2 98%  No data found.   Physical Exam  Constitutional: She appears well-developed and well-nourished.  HENT:  Head: Normocephalic and atraumatic.  Eyes: Conjunctivae and EOM are normal. Pupils are equal, round, and reactive to light.  Neck: Normal range of motion. Neck supple.  Cardiovascular: Normal rate, regular rhythm and normal heart sounds.   Pulmonary/Chest: Effort normal and breath sounds normal.  Musculoskeletal: She exhibits tenderness.  Tender right pre tibial bursa and patella.  Nursing note and vitals reviewed.   Urgent Care Course     Procedures (including critical care time)  Labs Review Labs Reviewed - No data to display  Imaging Review No results found.   Visual Acuity Review  Right Eye Distance:   Left Eye Distance:   Bilateral Distance:    Right Eye Near:   Left Eye Near:    Bilateral Near:         MDM   1. Patellar tendonitis of right knee  Toradol  IM Naprosyn  one po bid x 10 days #20     Deatra Canter, FNP 10/15/16 [phone]
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
## 2016-11-25 ENCOUNTER — Telehealth: Payer: Self-pay | Admitting: Family Medicine

## 2016-11-25 NOTE — Telephone Encounter (Signed)
Patient is past due for a F/u regarding hypertension (09/17). Patient was happy to schedule an appointment but asked to call back after reviewing her schedule. Will follow-up with front desk.

## 2016-12-16 ENCOUNTER — Telehealth: Payer: Self-pay | Admitting: Family Medicine

## 2016-12-16 NOTE — Telephone Encounter (Signed)
Pre-visit call. Appointment confirmed for 12/18/16. Patient was advised to arrive early for check-in and to bring all current medications. Patient stated that she has not had  Tdap.

## 2016-12-18 ENCOUNTER — Ambulatory Visit: Payer: Self-pay | Admitting: Family Medicine

## 2017-02-08 ENCOUNTER — Emergency Department (HOSPITAL_COMMUNITY): Admission: EM | Admit: 2017-02-08 | Discharge: 2017-02-08 | Discharge status: Against Medical Advice | Payer: Self-pay

## 2017-02-09 ENCOUNTER — Encounter (HOSPITAL_COMMUNITY): Payer: Self-pay | Admitting: Emergency Medicine

## 2017-02-09 ENCOUNTER — Emergency Department (HOSPITAL_COMMUNITY): Payer: No Typology Code available for payment source

## 2017-02-09 ENCOUNTER — Emergency Department (HOSPITAL_COMMUNITY)
Admission: EM | Admit: 2017-02-09 | Discharge: 2017-02-09 | Disposition: A | Discharge status: Home / Self Care | Payer: No Typology Code available for payment source | Attending: Emergency Medicine | Admitting: Emergency Medicine

## 2017-02-09 DIAGNOSIS — M545 Low back pain: Secondary | ICD-10-CM | POA: Diagnosis present

## 2017-02-09 DIAGNOSIS — F17210 Nicotine dependence, cigarettes, uncomplicated: Secondary | ICD-10-CM | POA: Insufficient documentation

## 2017-02-09 DIAGNOSIS — Z79899 Other long term (current) drug therapy: Secondary | ICD-10-CM | POA: Insufficient documentation

## 2017-02-09 DIAGNOSIS — Y999 Unspecified external cause status: Secondary | ICD-10-CM | POA: Insufficient documentation

## 2017-02-09 DIAGNOSIS — Y92410 Unspecified street and highway as the place of occurrence of the external cause: Secondary | ICD-10-CM | POA: Diagnosis not present

## 2017-02-09 DIAGNOSIS — Y939 Activity, unspecified: Secondary | ICD-10-CM | POA: Diagnosis not present

## 2017-02-09 LAB — POC URINE PREG, ED: PREG TEST UR: NEGATIVE

## 2017-02-09 MED ORDER — METHOCARBAMOL 500 MG PO TABS: 500.0000 mg | ORAL_TABLET | Freq: Every evening | ORAL | 0 refills | Status: DC | PRN

## 2017-02-09 MED ORDER — NAPROXEN 500 MG PO TABS: 500.0000 mg | ORAL_TABLET | Freq: Two times a day (BID) | ORAL | 0 refills | Status: DC

## 2017-02-09 NOTE — ED Notes (Addendum)
Pt given heat pack for back.  
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
## 2017-02-09 NOTE — ED Notes (Signed)
Patient transported to X-ray 

## 2017-02-09 NOTE — ED Notes (Signed)
XR called regarding pt scan, per XR transport en route to pt.
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
## 2017-02-09 NOTE — ED Notes (Signed)
Pt given d/c instructions and prescriptions. Verbalized understanding

## 2017-03-15 ENCOUNTER — Telehealth (HOSPITAL_COMMUNITY): Payer: Self-pay

## 2017-03-29 NOTE — Telephone Encounter (Signed)
Phone call

## 2017-05-25 ENCOUNTER — Telehealth (HOSPITAL_COMMUNITY): Payer: Self-pay

## 2017-05-25 NOTE — Telephone Encounter (Signed)
Left message regarding WISEWOMAN follow up.

## 2017-08-11 ENCOUNTER — Encounter (HOSPITAL_COMMUNITY): Payer: Self-pay

## 2017-08-18 IMAGING — MG DIGITAL SCREENING BILATERAL MAMMOGRAM WITH CAD
4 series · 4 of 4 positions shown · non-contrast
Comparison: None.

ACR Breast Density Category a: The breast tissue is almost entirely
fatty.

CLINICAL DATA: Screening.

EXAM:
DIGITAL SCREENING BILATERAL MAMMOGRAM WITH CAD

[R CC]
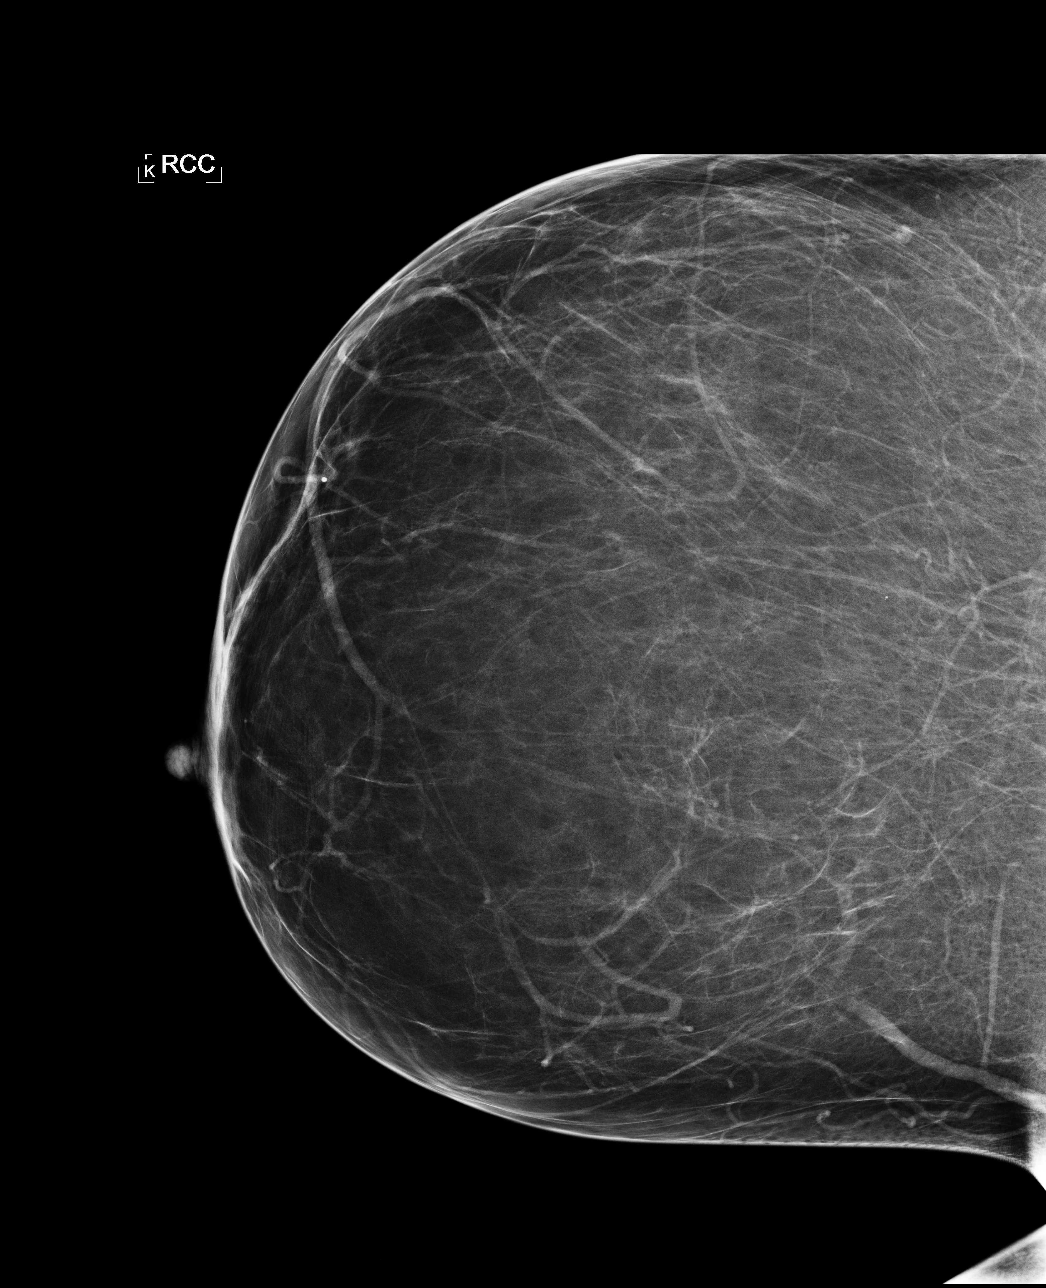

[L CC]
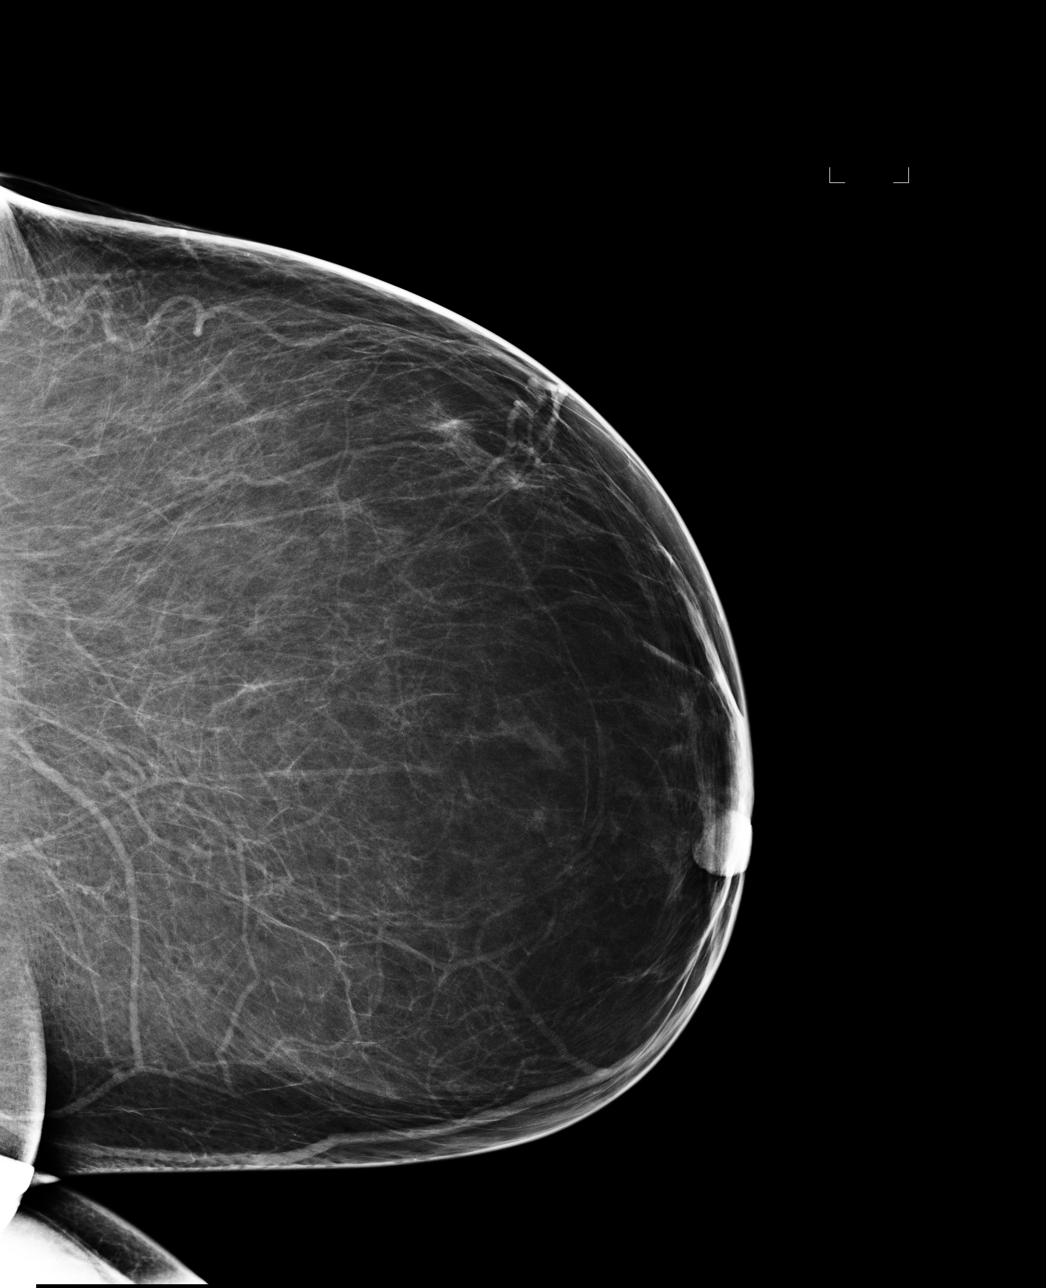

[R MLO]
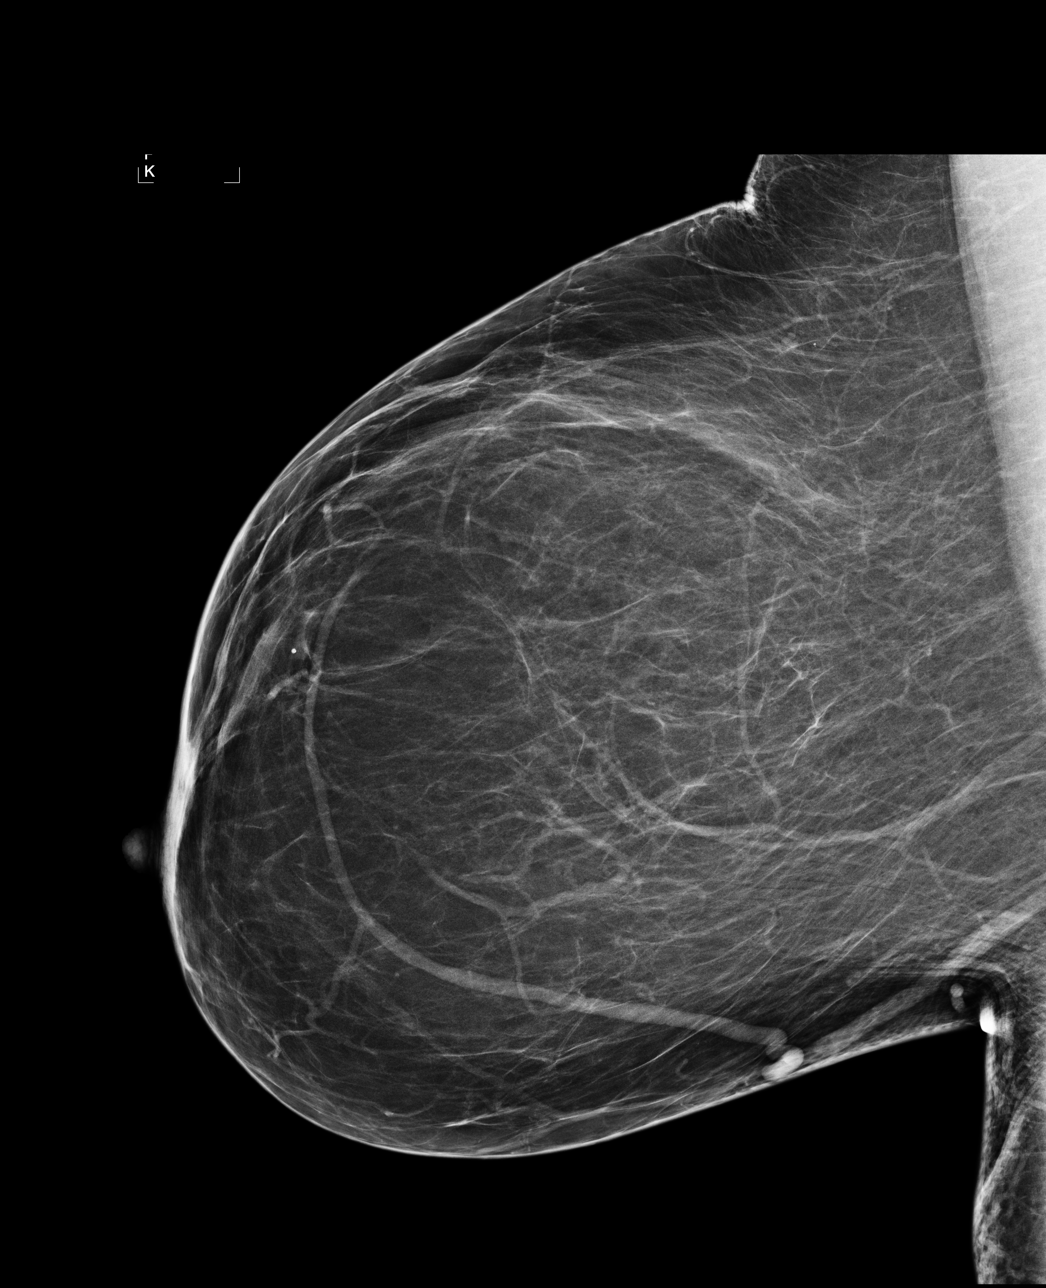

[L MLO]
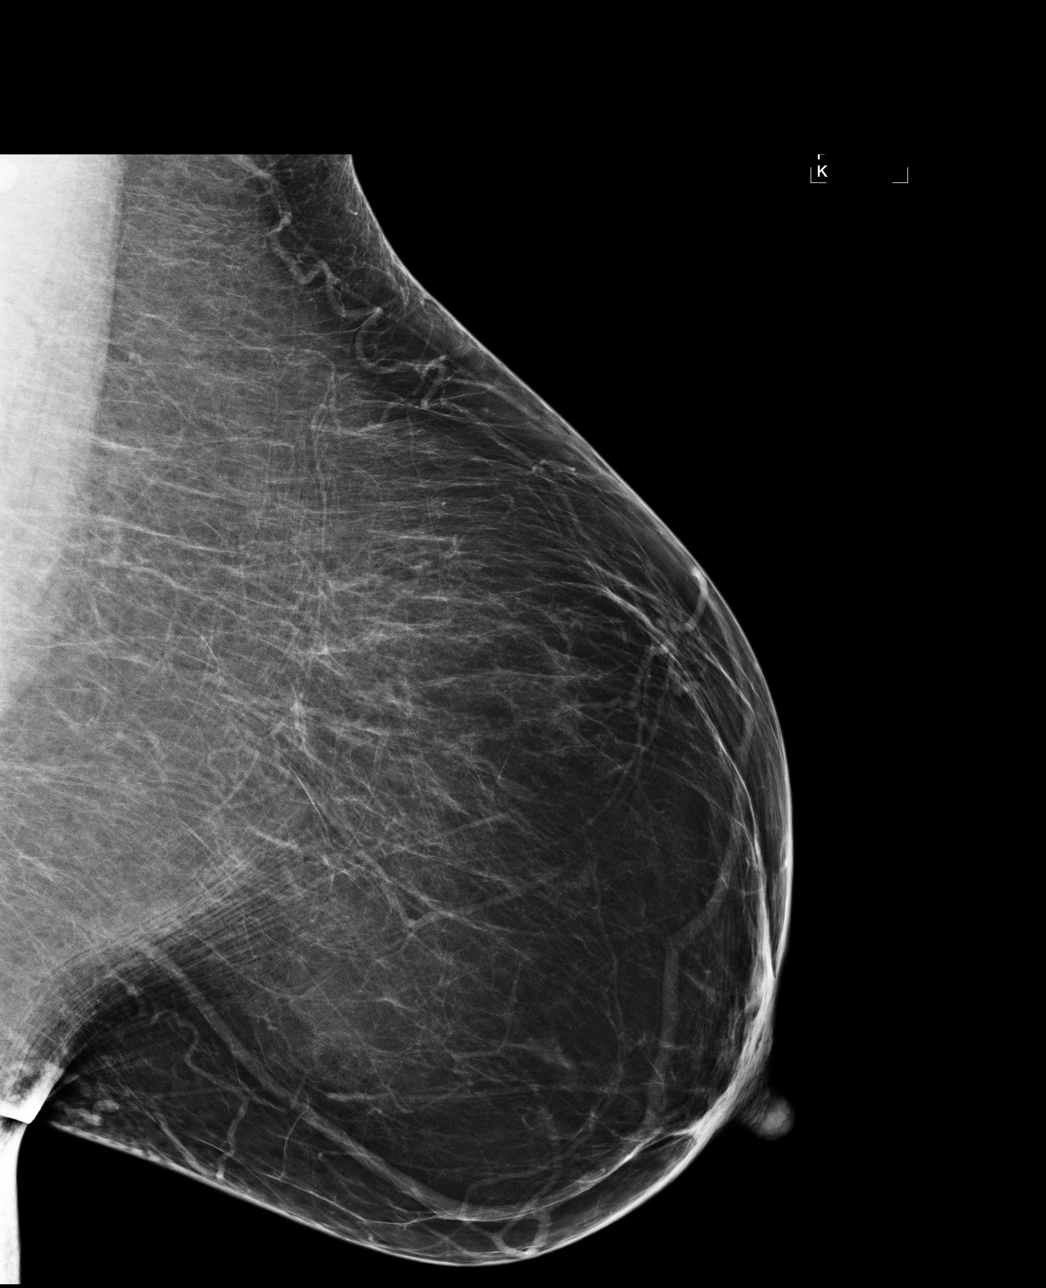

[4 of 4 positions shown; findings below may reference images not displayed]

FINDINGS: There are no findings suspicious for malignancy. Images were
processed with CAD.
IMPRESSION: No mammographic evidence of malignancy. A result letter of this
screening mammogram will be mailed directly to the patient.

RECOMMENDATION:
Screening mammogram in one year. (Code:JT-G-VWB)

BI-RADS CATEGORY  1: Negative.

## 2017-11-16 IMAGING — US US TRANSVAGINAL NON-OB
1 series · 15 of 25 positions shown · non-contrast
Comparison: None

CLINICAL DATA: Dysfunctional uterine bleeding.



[Series 1: us transvaginal non-ob · 15 of 51 slices shown]
[im 1/51]
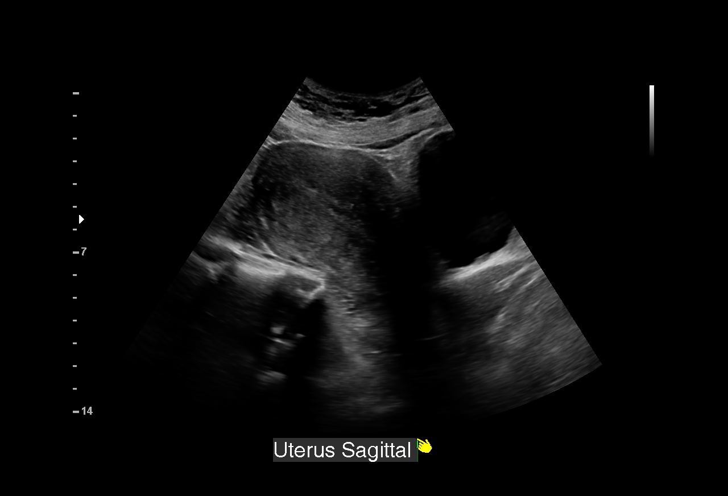
[im 5/51]
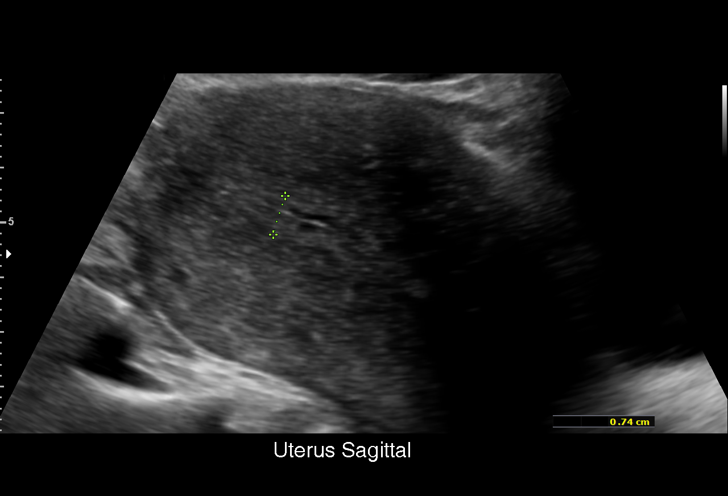
[im 9/51]
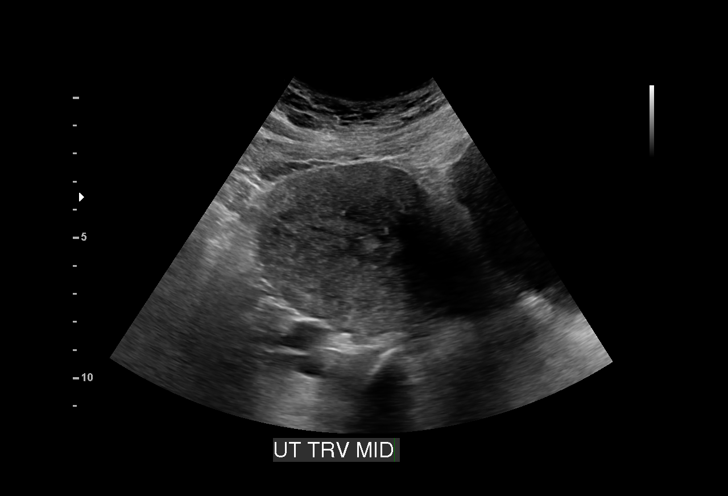
[im 11/51]
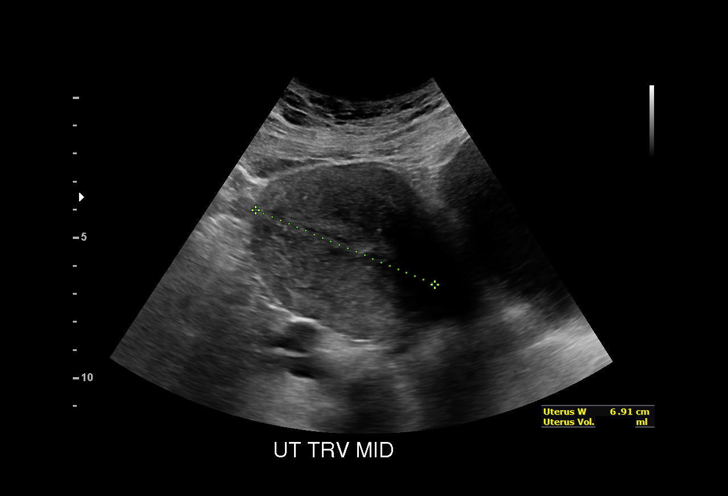
[im 15/51]
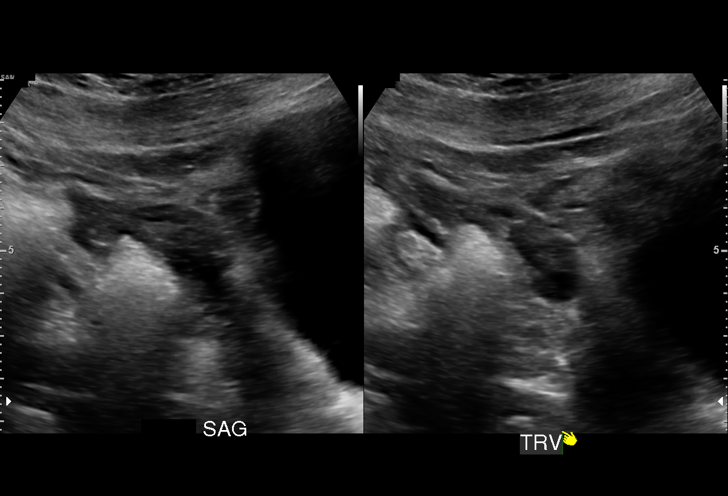
[im 19/51]
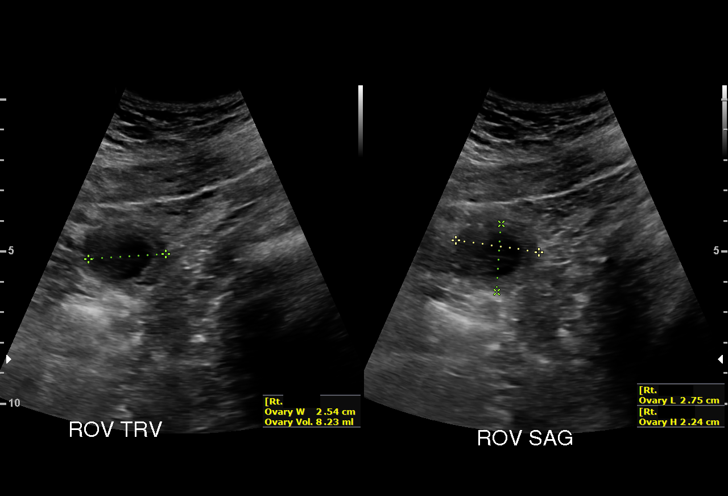
[im 21/51]
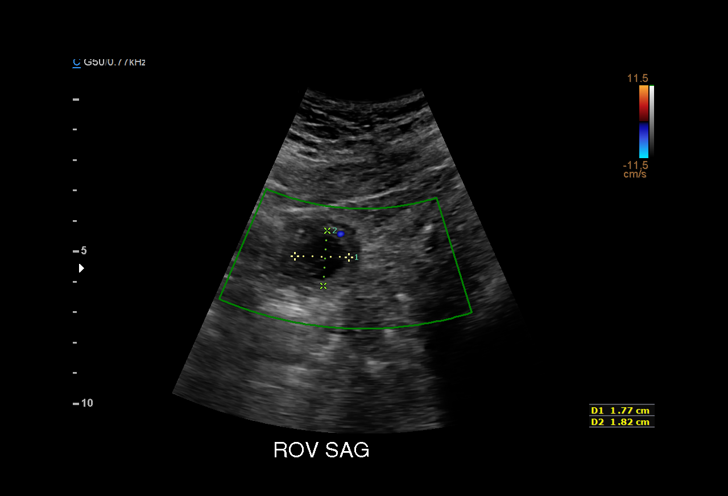
[im 26/51]
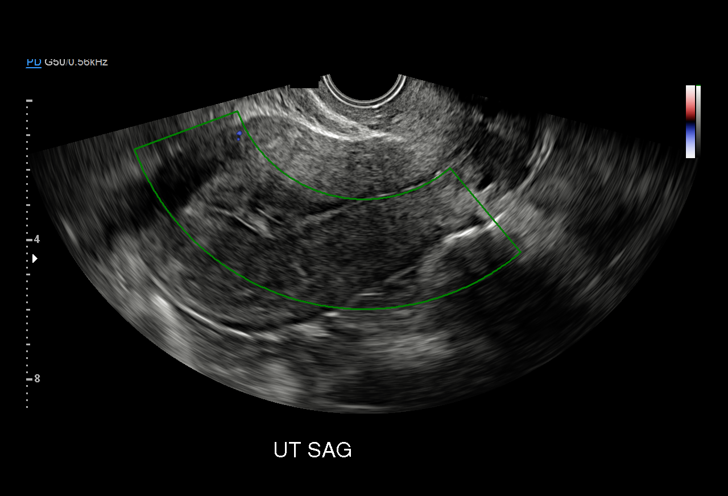
[im 30/51]
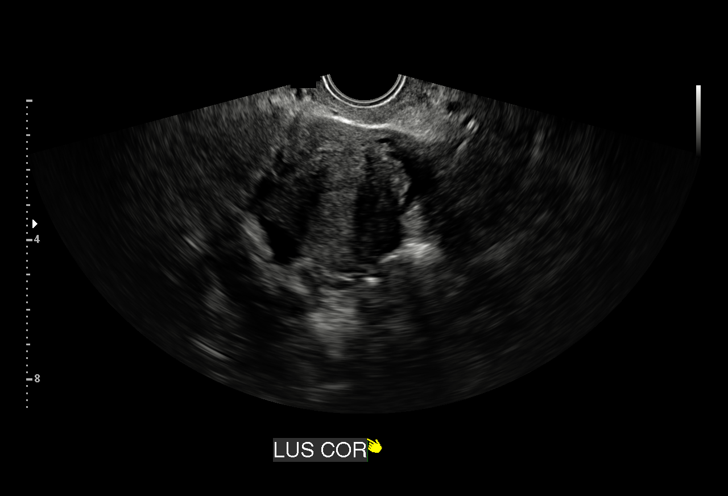
[im 32/51]
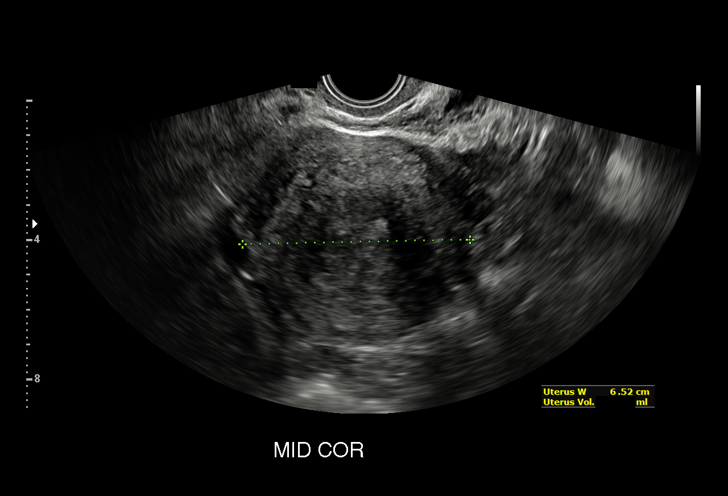
[im 36/51]
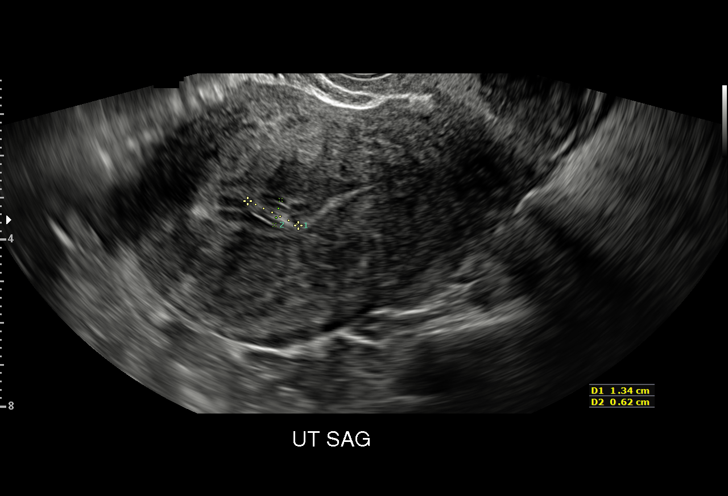
[im 40/51]
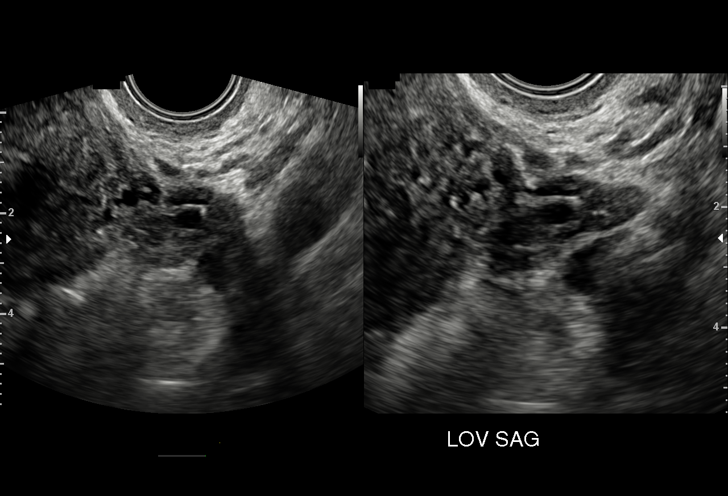
[im 42/51]
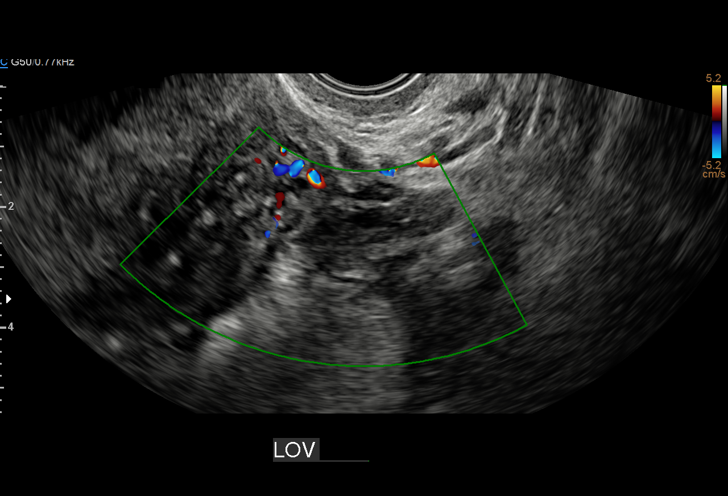
[im 46/51]
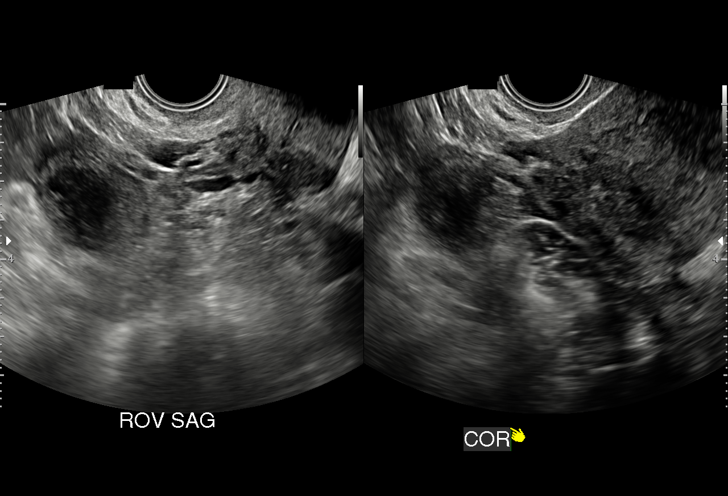
[im 51/51]
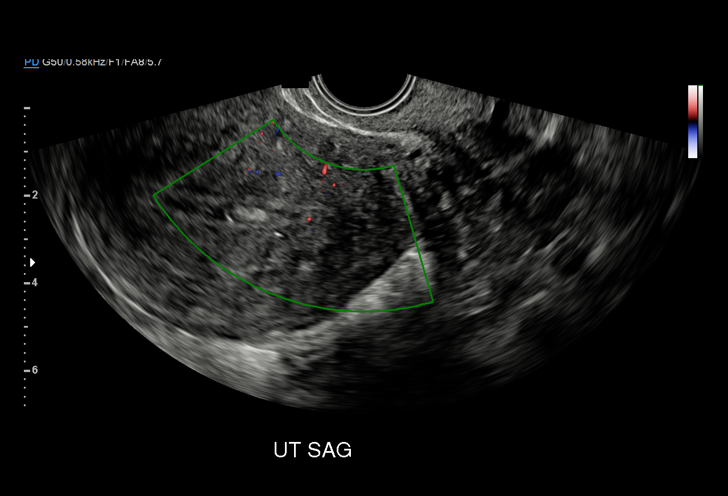

[15 of 25 positions shown; findings below may reference images not displayed]

FINDINGS: Uterus

Measurements: 11.7 x 6.2 x 6.5 cm. No fibroids or other mass
visualized.

Endometrium

Thickness: 6 mm. In the upper endometrial canal is a 1.3 x 0.6 x
cm heterogeneously hyperechoic focus without internal vascularity
demonstrated on color Doppler.

Right ovary

Measurements: 3.1 x 2.5 x 2.5 cm. 2.1 x 1.8 x 1.8 cm thick-walled
cyst with a few internal echoes and some peripheral vascularity,
likely benign and possibly a corpus luteum.

Left ovary

Measurements: 1.7 x 0.9 x 2.0 cm. Normal appearance/no adnexal mass.

Other findings

No abnormal free fluid.
IMPRESSION: 1.3 cm echogenic focus in the upper endometrial canal.
Considerations include polyp, focal endometrial hyperplasia, blood
products, and less likely submucosal fibroid or carcinoma. Consider
further evaluation with sonohysterogram for confirmation prior to
hysteroscopy. Endometrial sampling should also be considered if
patient is at high risk for endometrial carcinoma. (Ref:
Radiological Reasoning: Algorithmic Workup of Abnormal Vaginal
Bleeding with Endovaginal Sonography and Sonohysterography. AJR
[id number]; 191:S68-73)

## 2018-01-24 ENCOUNTER — Other Ambulatory Visit: Payer: Self-pay

## 2018-01-24 ENCOUNTER — Emergency Department (HOSPITAL_COMMUNITY)
Admission: EM | Admit: 2018-01-24 | Discharge: 2018-01-24 | Disposition: A | Discharge status: Home / Self Care | Payer: Self-pay | Attending: Emergency Medicine | Admitting: Emergency Medicine

## 2018-01-24 ENCOUNTER — Encounter (HOSPITAL_COMMUNITY): Payer: Self-pay | Admitting: Emergency Medicine

## 2018-01-24 ENCOUNTER — Emergency Department (HOSPITAL_COMMUNITY): Payer: Self-pay

## 2018-01-24 DIAGNOSIS — R112 Nausea with vomiting, unspecified: Secondary | ICD-10-CM | POA: Insufficient documentation

## 2018-01-24 DIAGNOSIS — I10 Essential (primary) hypertension: Secondary | ICD-10-CM | POA: Insufficient documentation

## 2018-01-24 DIAGNOSIS — F17210 Nicotine dependence, cigarettes, uncomplicated: Secondary | ICD-10-CM | POA: Insufficient documentation

## 2018-01-24 DIAGNOSIS — R05 Cough: Secondary | ICD-10-CM | POA: Insufficient documentation

## 2018-01-24 DIAGNOSIS — R1110 Vomiting, unspecified: Secondary | ICD-10-CM

## 2018-01-24 DIAGNOSIS — Z79899 Other long term (current) drug therapy: Secondary | ICD-10-CM | POA: Insufficient documentation

## 2018-01-24 DIAGNOSIS — R059 Cough, unspecified: Secondary | ICD-10-CM

## 2018-01-24 LAB — URINALYSIS, ROUTINE W REFLEX MICROSCOPIC
Bilirubin Urine: NEGATIVE
Glucose, UA: NEGATIVE mg/dL
HGB URINE DIPSTICK: NEGATIVE
KETONES UR: 5 mg/dL — AB
NITRITE: NEGATIVE
PROTEIN: NEGATIVE mg/dL
Specific Gravity, Urine: 1.033 — ABNORMAL HIGH (ref 1.005–1.030)
pH: 6 (ref 5.0–8.0)

## 2018-01-24 LAB — CBC WITH DIFFERENTIAL/PLATELET
Abs Immature Granulocytes: 0 10*3/uL (ref 0.0–0.1)
BASOS ABS: 0 10*3/uL (ref 0.0–0.1)
Basophils Relative: 1 %
EOS PCT: 4 %
Eosinophils Absolute: 0.3 10*3/uL (ref 0.0–0.7)
HCT: 36.7 % (ref 36.0–46.0)
HEMOGLOBIN: 10.8 g/dL — AB (ref 12.0–15.0)
IMMATURE GRANULOCYTES: 0 %
Lymphocytes Relative: 48 %
Lymphs Abs: 3.3 10*3/uL (ref 0.7–4.0)
MCH: 25.5 pg — ABNORMAL LOW (ref 26.0–34.0)
MCHC: 29.4 g/dL — AB (ref 30.0–36.0)
MCV: 86.6 fL (ref 78.0–100.0)
Monocytes Absolute: 0.6 10*3/uL (ref 0.1–1.0)
Monocytes Relative: 9 %
NEUTROS PCT: 38 %
Neutro Abs: 2.5 10*3/uL (ref 1.7–7.7)
Platelets: 381 10*3/uL (ref 150–400)
RBC: 4.24 MIL/uL (ref 3.87–5.11)
RDW: 17.2 % — ABNORMAL HIGH (ref 11.5–15.5)
WBC: 6.7 10*3/uL (ref 4.0–10.5)

## 2018-01-24 LAB — COMPREHENSIVE METABOLIC PANEL
ALBUMIN: 3.1 g/dL — AB (ref 3.5–5.0)
ALK PHOS: 59 U/L (ref 38–126)
ALT: 16 U/L (ref 0–44)
ANION GAP: 8 (ref 5–15)
AST: 20 U/L (ref 15–41)
BUN: 13 mg/dL (ref 6–20)
CO2: 26 mmol/L (ref 22–32)
Calcium: 9.1 mg/dL (ref 8.9–10.3)
Chloride: 104 mmol/L (ref 98–111)
Creatinine, Ser: 0.67 mg/dL (ref 0.44–1.00)
GFR calc Af Amer: >60 mL/min (ref >60)
GFR calc non Af Amer: >60 mL/min (ref >60)
GLUCOSE: 107 mg/dL — AB (ref 70–99)
Potassium: 4.6 mmol/L (ref 3.5–5.1)
SODIUM: 138 mmol/L (ref 135–145)
Total Bilirubin: 0.4 mg/dL (ref 0.3–1.2)
Total Protein: 7.4 g/dL (ref 6.5–8.1)

## 2018-01-24 LAB — LIPASE, BLOOD: Lipase: 33 U/L (ref 11–51)

## 2018-01-24 MED ORDER — ONDANSETRON 4 MG PO TBDP
4.0000 mg | ORAL_TABLET | Freq: Once | ORAL | Status: AC
  Administered 2018-01-24: 4 mg via ORAL
  Filled 2018-01-24: qty 1

## 2018-01-24 MED ORDER — ONDANSETRON HCL 4 MG PO TABS: 4.0000 mg | ORAL_TABLET | Freq: Four times a day (QID) | ORAL | 0 refills | Status: DC

## 2018-01-24 MED ORDER — BENZONATATE 100 MG PO CAPS: 100.0000 mg | ORAL_CAPSULE | Freq: Three times a day (TID) | ORAL | 0 refills | Status: DC

## 2018-01-24 MED ORDER — LORATADINE 10 MG PO TABS: 10.0000 mg | ORAL_TABLET | Freq: Every day | ORAL | 0 refills | Status: DC

## 2018-01-24 NOTE — ED Notes (Signed)
Pt given gingerale and ice chips for PO challenge. Pt tolerating well. Will continue to monitor.

## 2018-01-24 NOTE — ED Notes (Signed)
Urine culture tube sent down with urine sample.   

## 2018-01-24 NOTE — ED Notes (Signed)
Pt verbalizes understanding of d/c instructions. Pt received prescriptions. Pt ambulatory at d/c with all belongings.  

## 2018-01-24 NOTE — ED Triage Notes (Signed)
Patient complains sinus congestion and emesis since Saturday. Last vomited last night. Patient alert, oriented, and in no apparent distress at this time.

## 2018-01-24 NOTE — ED Provider Notes (Signed)
Patient placed in Quick Look pathway, seen and evaluated   Chief Complaint: Cough, congestion, nausea, vomiting  HPI: Patient reports cough and nasal congestion worsening since Wednesday.  On Saturday she developed persistent nausea and vomiting and has not been able to keep anything down, last emesis was last night.  She reports cough persistently productive of mucus.  She denies associated abdominal pain, subjective fevers and chills but has not taken her temperature.  Shortness of breath only with coughing, no chest pain.  ROS: + Sinus congestion, productive cough, nausea, vomiting, chills. - Abdominal pain, chest pain, shortness of breath  Physical Exam:   Gen: No distress  Neuro: Awake and Alert  Skin: Warm    Focused Exam: Lungs with scattered rhonchi bilaterally.  Heart with regular rate and rhythm, abdomen soft, nontender   Initiation of care has begun. The patient has been counseled on the process, plan, and necessity for staying for the completion/evaluation, and the remainder of the medical screening examination    Legrand RamsFord, Madelene Kaatz N, PA-C 01/24/18 1501    Shaune PollackIsaacs, Cameron, MD 01/24/18 [phone]

## 2018-01-24 NOTE — Discharge Instructions (Addendum)
Please read attached information. If you experience any new or worsening signs or symptoms please return to the emergency room for evaluation. Please follow-up with your primary care provider or specialist as discussed. Please use medication prescribed only as directed and discontinue taking if you have any concerning signs or symptoms.   °

## 2018-01-26 NOTE — ED Provider Notes (Signed)
[facility] EMERGENCY DEPARTMENT Provider Note   CSN: [id number] Arrival date & time: 01/24/18  1442     History   Chief Complaint Chief Complaint  Patient presents with  . Emesis    HPI Janet Wise is a 42 y.o. female.  HPI   42 year old female presents today with complaints of upper respiratory ingestion, nausea and vomiting.  Patient notes that symptoms started 3 days ago with rhinorrhea nasal congestion.  Patient notes that she vomited last night.  Patient notes the vomiting is generally after fits of coughing.  She is able to tolerate p.o. today.  She denies any fever, denies any shortness of breath or chest pain.  No medications prior to arrival.  Past Medical History:  Diagnosis Date  . Hypertension 2012    Patient Active Problem List   Diagnosis Date Noted  . Dysfunctional uterine bleeding 03/18/2016  . Healthcare maintenance 03/18/2016  . Essential hypertension 02/16/2016  . Tobacco abuse counseling 02/16/2016    History reviewed. No pertinent surgical history.   OB History    Gravida  8   Para  6   Term  6   Preterm      AB  2   Living  6     SAB  2   TAB      Ectopic      Multiple      Live Births               Home Medications    Prior to Admission medications   Medication Sig Start Date End Date Taking? Authorizing Provider  benzonatate (TESSALON) 100 MG capsule Take 1 capsule (100 mg total) by mouth every 8 (eight) hours. 01/24/18   Albeiro Trompeter, Tinnie Gens, PA-C  hydrochlorothiazide (HYDRODIURIL) 12.5 MG tablet Take 1 tablet (12.5 mg total) by mouth daily. 03/23/16   Beaulah Dinning, MD  loratadine (CLARITIN) 10 MG tablet Take 1 tablet (10 mg total) by mouth daily. 01/24/18   Cylinda Santoli, Tinnie Gens, PA-C  methocarbamol (ROBAXIN) 500 MG tablet Take 1 tablet (500 mg total) by mouth at bedtime as needed for muscle spasms. 02/09/17   Mathews Robinsons B, PA-C  naproxen (NAPROSYN) 500 MG tablet Take 1 tablet (500 mg total)  by mouth 2 (two) times daily with a meal. 02/09/17   Mathews Robinsons B, PA-C  ondansetron (ZOFRAN) 4 MG tablet Take 1 tablet (4 mg total) by mouth every 6 (six) hours. 01/24/18   Eyvonne Mechanic, PA-C    Family History Family History  Problem Relation Age of Onset  . Diabetes Maternal Grandmother   . Hypertension Maternal Grandmother   . Diabetes Paternal Grandmother   . Hypertension Paternal Grandmother     Social History Social History   Tobacco Use  . Smoking status: Current Every Day Smoker    Packs/day: 0.25    Years: 4.00    Pack years: 1.00    Types: Cigarettes  . Smokeless tobacco: Never Used  Substance Use Topics  . Alcohol use: No  . Drug use: No     Allergies   Patient has no known allergies.   Review of Systems Review of Systems  All other systems reviewed and are negative.    Physical Exam Updated Vital Signs BP (!) 143/95   Pulse 76   Temp 99.2 F (37.3 C) (Oral)   Resp 16   SpO2 98%   Physical Exam  Constitutional: She is oriented to person, place, and time. She appears well-developed and well-nourished.  HENT:  Head: Normocephalic and atraumatic.  Rhinorrhea  Eyes: Pupils are equal, round, and reactive to light. Conjunctivae are normal. Right eye exhibits no discharge. Left eye exhibits no discharge. No scleral icterus.  Neck: Normal range of motion. No JVD present. No tracheal deviation present.  Cardiovascular: Normal rate, regular rhythm, normal heart sounds and intact distal pulses. Exam reveals no gallop and no friction rub.  No murmur heard. Pulmonary/Chest: Effort normal and breath sounds normal. No stridor. No respiratory distress. She has no wheezes. She has no rales.  Abdominal: Soft. She exhibits no distension and no mass. There is no tenderness. There is no rebound and no guarding. No hernia.  Neurological: She is alert and oriented to person, place, and time. Coordination normal.  Psychiatric: She has a normal mood and affect.  Her behavior is normal. Judgment and thought content normal.  Nursing note and vitals reviewed.    ED Treatments / Results  Labs (all labs ordered are listed, but only abnormal results are displayed) Labs Reviewed  CBC WITH DIFFERENTIAL/PLATELET - Abnormal; Notable for the following components:      Result Value   Hemoglobin 10.8 (*)    MCH 25.5 (*)    MCHC 29.4 (*)    RDW 17.2 (*)    All other components within normal limits  COMPREHENSIVE METABOLIC PANEL - Abnormal; Notable for the following components:   Glucose, Bld 107 (*)    Albumin 3.1 (*)    All other components within normal limits  URINALYSIS, ROUTINE W REFLEX MICROSCOPIC - Abnormal; Notable for the following components:   APPearance HAZY (*)    Specific Gravity, Urine 1.033 (*)    Ketones, ur 5 (*)    Leukocytes, UA SMALL (*)    Bacteria, UA RARE (*)    All other components within normal limits  LIPASE, BLOOD    EKG None  Radiology No results found.  Procedures Procedures (including critical care time)  Medications Ordered in ED Medications  ondansetron (ZOFRAN-ODT) disintegrating tablet 4 mg (4 mg Oral Given 01/24/18 1456)     Initial Impression / Assessment and Plan / ED Course  I have reviewed the triage vital signs and the nursing notes.  Pertinent labs & imaging results that were available during my care of the patient were reviewed by me and considered in my medical decision making (see chart for details).     Labs:   Imaging:  Consults:  Therapeutics:  Discharge Meds:   Assessment/Plan: 42 year old female presents today with cough question viral URI versus allergic.  Patient treated with antihistamines cough medication and antinausea medication.  No signs of bacterial infection, symptomatic care, strict return precautions.  She verbalized understanding and agreement to today's plan.   Final Clinical Impressions(s) / ED Diagnoses   Final diagnoses:  Non-intractable vomiting, presence  of nausea not specified, unspecified vomiting type  Cough    ED Discharge Orders        Ordered    benzonatate (TESSALON) 100 MG capsule  Every 8 hours     01/24/18 1827    loratadine (CLARITIN) 10 MG tablet  Daily     01/24/18 1827    ondansetron (ZOFRAN) 4 MG tablet  Every 6 hours     01/24/18 1827       Eyvonne MechanicHedges, Deatrice Spanbauer, PA-C 01/26/18 2133    Bethann BerkshireZammit, Joseph, MD 01/29/18 [phone]

## 2019-09-17 IMAGING — DX DG CHEST 2V
2 series · 2 of 2 positions shown · non-contrast
Comparison: None.

CLINICAL DATA: Cough and congestion for almost 1 week.

EXAM:
CHEST - 2 VIEW

[w chest pa]
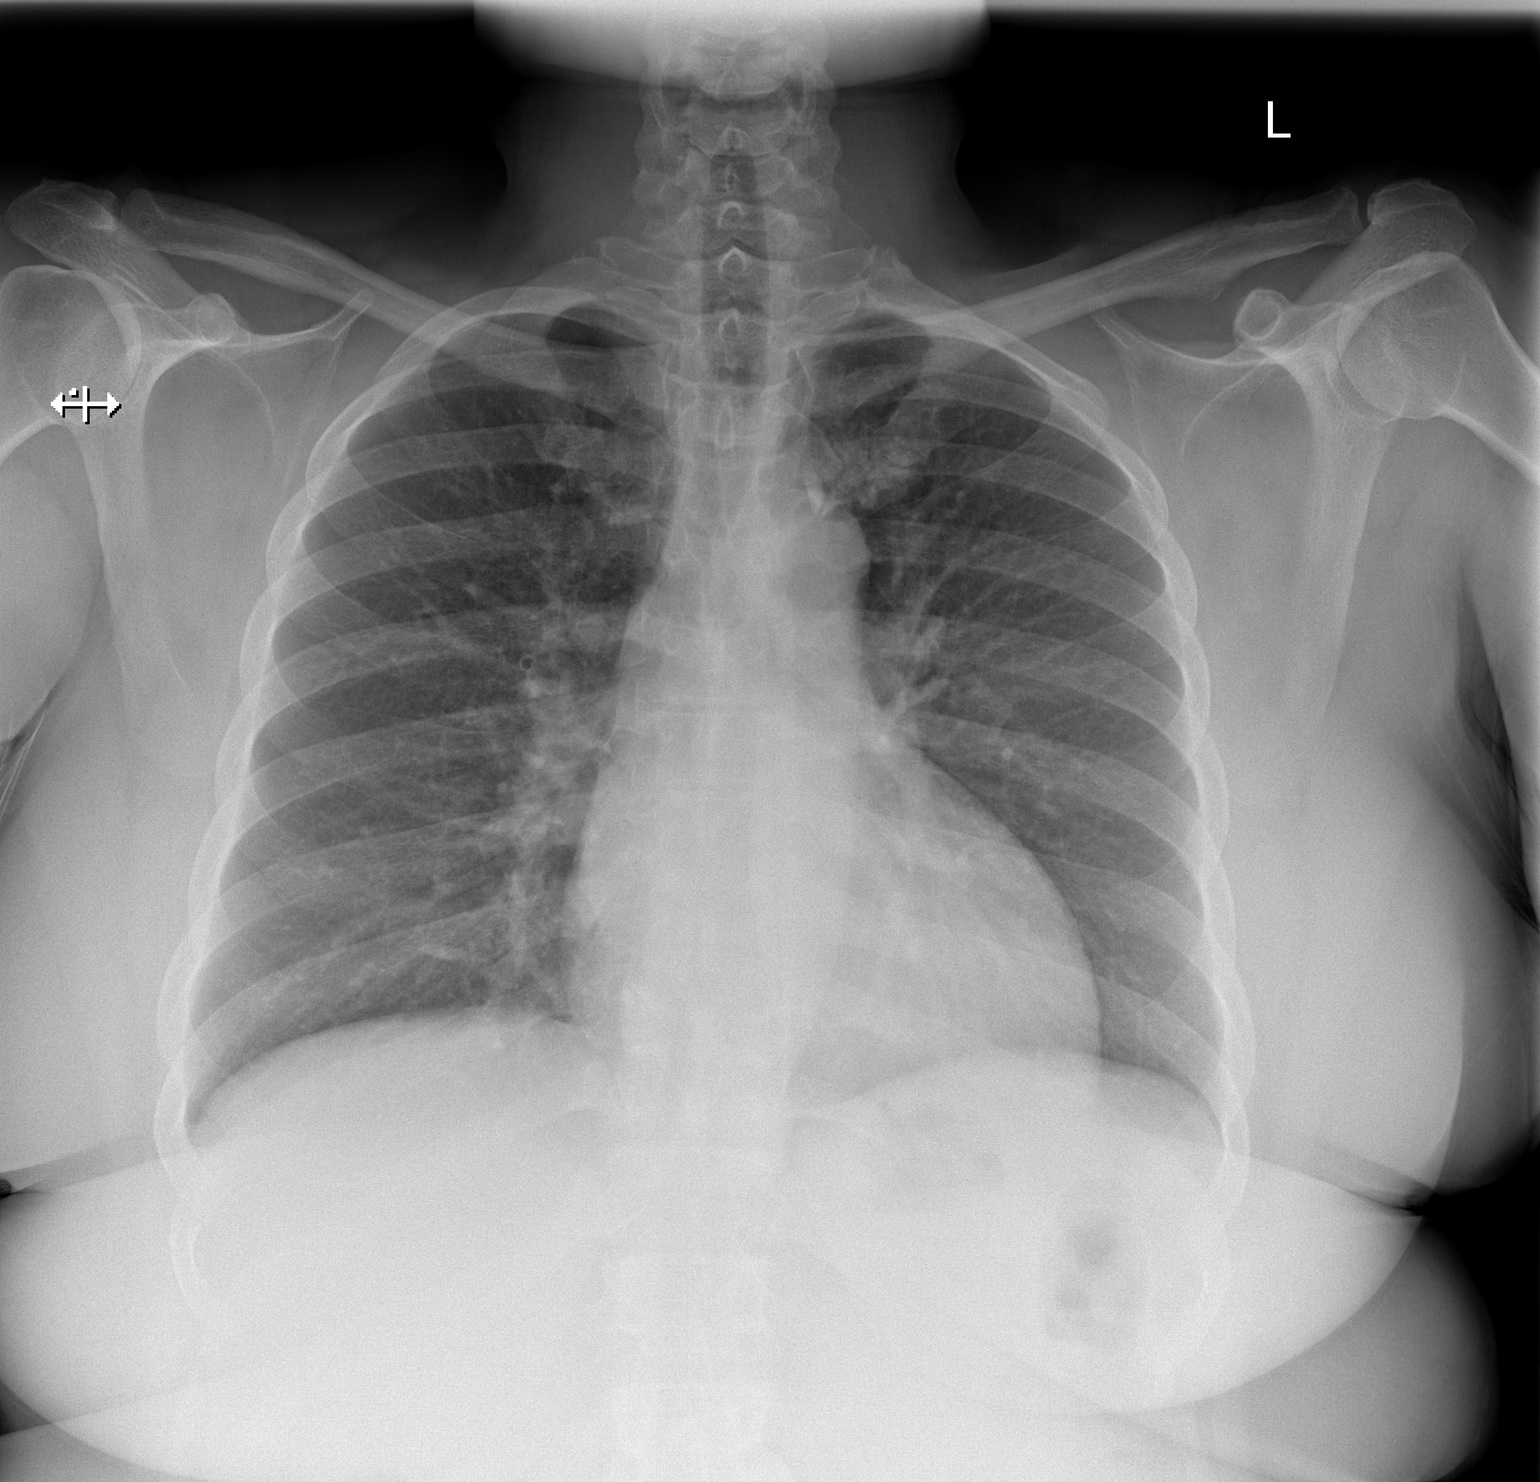

[w chest lat]
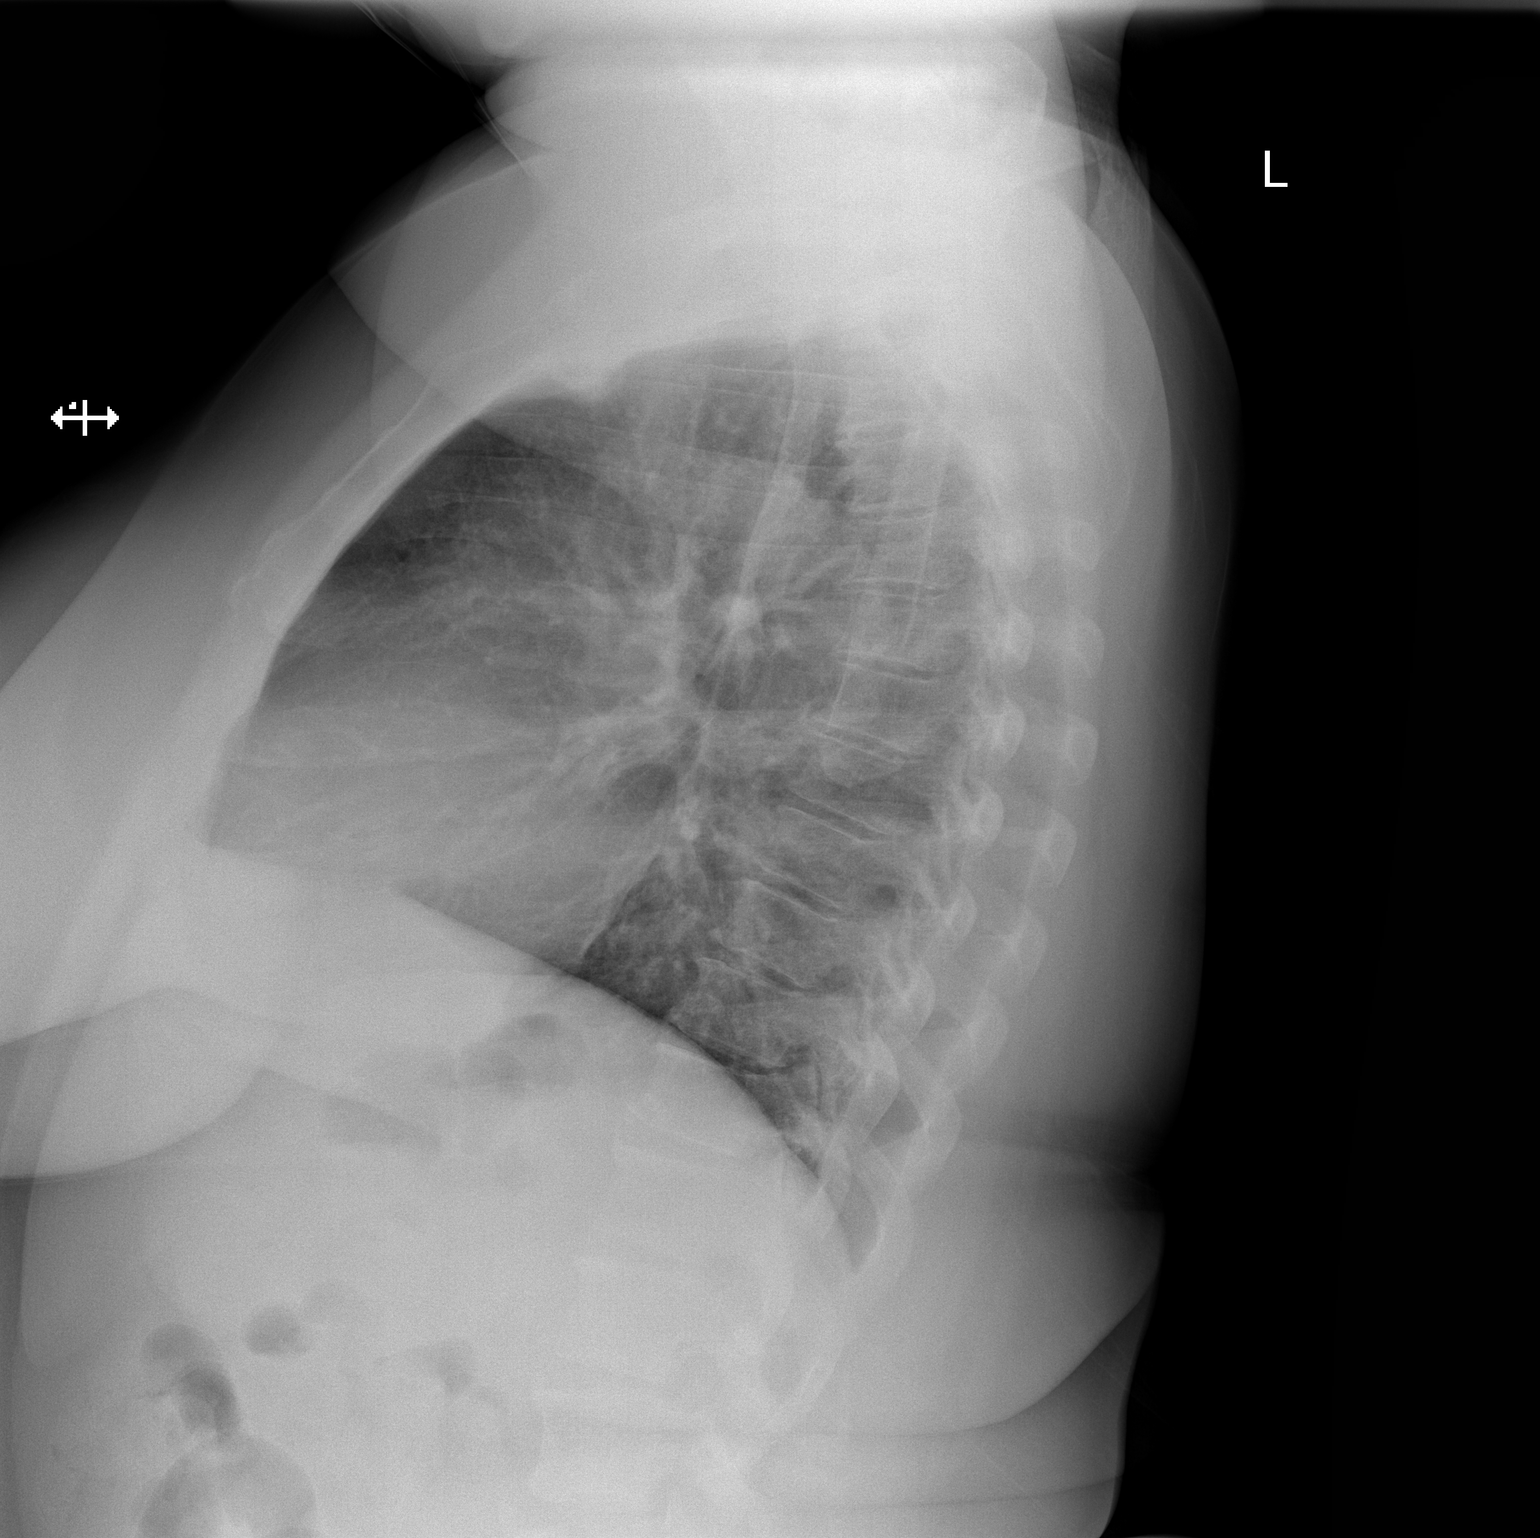

[2 of 2 positions shown; findings below may reference images not displayed]

FINDINGS: The lungs are clear. Heart size is normal. There is no pneumothorax
or pleural fluid. No acute or focal bony abnormality.
IMPRESSION: Negative chest.

## 2021-10-27 DIAGNOSIS — Z131 Encounter for screening for diabetes mellitus: Secondary | ICD-10-CM | POA: Diagnosis not present

## 2021-10-27 DIAGNOSIS — Z113 Encounter for screening for infections with a predominantly sexual mode of transmission: Secondary | ICD-10-CM | POA: Diagnosis not present

## 2021-10-27 DIAGNOSIS — Z13220 Encounter for screening for lipoid disorders: Secondary | ICD-10-CM | POA: Diagnosis not present

## 2022-01-05 DIAGNOSIS — Z113 Encounter for screening for infections with a predominantly sexual mode of transmission: Secondary | ICD-10-CM | POA: Diagnosis not present

## 2022-01-05 DIAGNOSIS — Z01419 Encounter for gynecological examination (general) (routine) without abnormal findings: Secondary | ICD-10-CM | POA: Diagnosis not present

## 2022-01-05 DIAGNOSIS — N912 Amenorrhea, unspecified: Secondary | ICD-10-CM | POA: Diagnosis not present

## 2022-01-05 DIAGNOSIS — N898 Other specified noninflammatory disorders of vagina: Secondary | ICD-10-CM | POA: Diagnosis not present

## 2022-01-23 DIAGNOSIS — Z1231 Encounter for screening mammogram for malignant neoplasm of breast: Secondary | ICD-10-CM | POA: Diagnosis not present

## 2022-02-09 DIAGNOSIS — Z1211 Encounter for screening for malignant neoplasm of colon: Secondary | ICD-10-CM | POA: Diagnosis not present

## 2022-06-24 ENCOUNTER — Emergency Department (HOSPITAL_COMMUNITY): Payer: BC Managed Care – PPO

## 2022-06-24 ENCOUNTER — Emergency Department (HOSPITAL_COMMUNITY)
Admission: EM | Admit: 2022-06-24 | Discharge: 2022-06-24 | Disposition: A | Discharge status: Home / Self Care | Payer: BC Managed Care – PPO | Attending: Emergency Medicine | Admitting: Emergency Medicine

## 2022-06-24 ENCOUNTER — Other Ambulatory Visit: Payer: Self-pay

## 2022-06-24 ENCOUNTER — Encounter (HOSPITAL_COMMUNITY): Payer: Self-pay | Admitting: Emergency Medicine

## 2022-06-24 DIAGNOSIS — W230XXA Caught, crushed, jammed, or pinched between moving objects, initial encounter: Secondary | ICD-10-CM | POA: Diagnosis not present

## 2022-06-24 DIAGNOSIS — Z79899 Other long term (current) drug therapy: Secondary | ICD-10-CM | POA: Diagnosis not present

## 2022-06-24 DIAGNOSIS — S61411A Laceration without foreign body of right hand, initial encounter: Secondary | ICD-10-CM | POA: Diagnosis not present

## 2022-06-24 DIAGNOSIS — Z23 Encounter for immunization: Secondary | ICD-10-CM | POA: Insufficient documentation

## 2022-06-24 DIAGNOSIS — S66322A Laceration of extensor muscle, fascia and tendon of right middle finger at wrist and hand level, initial encounter: Secondary | ICD-10-CM | POA: Insufficient documentation

## 2022-06-24 DIAGNOSIS — S60921A Unspecified superficial injury of right hand, initial encounter: Secondary | ICD-10-CM | POA: Diagnosis not present

## 2022-06-24 DIAGNOSIS — R58 Hemorrhage, not elsewhere classified: Secondary | ICD-10-CM | POA: Diagnosis not present

## 2022-06-24 DIAGNOSIS — S61209A Unspecified open wound of unspecified finger without damage to nail, initial encounter: Secondary | ICD-10-CM

## 2022-06-24 DIAGNOSIS — I10 Essential (primary) hypertension: Secondary | ICD-10-CM | POA: Diagnosis not present

## 2022-06-24 DIAGNOSIS — M7989 Other specified soft tissue disorders: Secondary | ICD-10-CM | POA: Diagnosis not present

## 2022-06-24 MED ORDER — CEPHALEXIN 500 MG PO CAPS: 500.0000 mg | ORAL_CAPSULE | Freq: Four times a day (QID) | ORAL | 0 refills | Status: AC

## 2022-06-24 MED ORDER — TETANUS-DIPHTH-ACELL PERTUSSIS 5-2.5-18.5 LF-MCG/0.5 IM SUSY
0.5000 mL | PREFILLED_SYRINGE | Freq: Once | INTRAMUSCULAR | Status: AC
Start: 2022-06-24 — End: 2022-06-24
  Administered 2022-06-24: 0.5 mL via INTRAMUSCULAR

## 2022-06-24 MED ORDER — STERILE WATER FOR INJECTION IJ SOLN
INTRAMUSCULAR | Status: AC
  Filled 2022-06-24: qty 10

## 2022-06-24 MED ORDER — CEFAZOLIN SODIUM 1 G IM
1.0000 g | Freq: Once | INTRAMUSCULAR | Status: AC
  Administered 2022-06-24: 1 g via INTRAMUSCULAR
  Filled 2022-06-24: qty 1000

## 2022-06-24 MED ORDER — CEPHALEXIN 500 MG PO CAPS: 500.0000 mg | ORAL_CAPSULE | Freq: Four times a day (QID) | ORAL | 0 refills | Status: DC

## 2022-06-24 MED ORDER — HYDROCODONE-ACETAMINOPHEN 5-325 MG PO TABS
1.0000 | ORAL_TABLET | Freq: Once | ORAL | Status: AC
  Administered 2022-06-24: 1 via ORAL
  Filled 2022-06-24: qty 1

## 2022-06-24 MED ORDER — LIDOCAINE HCL 2 % IJ SOLN
15.0000 mL | Freq: Once | INTRAMUSCULAR | Status: AC
  Administered 2022-06-24: 300 mg

## 2022-06-24 NOTE — ED Provider Triage Note (Signed)
Emergency Medicine Provider Triage Evaluation Note  Janet Wise , a 46 y.o. female  was evaluated in triage.  Pt complains of right hand laceration from broken glass.  Review of Systems  Positive: Laceration, numbness and tingling Negative: Weakness  Physical Exam  BP (!) 160/119 (BP Location: Left Arm)   Pulse 91   Temp 98.4 F (36.9 C)   Resp 20   Wt 87.1 kg   SpO2 96%   BMI 32.96 kg/m  Gen:   Awake, no distress   Resp:  Normal effort  MSK:   Moves extremities without difficulty  Other:  Extensive laceration involving dorsal surface of third, fourth and fifth digits crossing MCP joints.  Able to flex and extend at DIP and PIP joints though somewhat weak.  Medical Decision Making  Medically screening exam initiated at 8:21 AM.  Appropriate orders placed.  Janet Wise was informed that the remainder of the evaluation will be completed by another provider, this initial triage assessment does not replace that evaluation, and the importance of remaining in the ED until their evaluation is complete.     Glynn Octave, MD 06/24/22 [phone]

## 2022-06-24 NOTE — Discharge Instructions (Signed)
Follow-up with hand Ortho, call to schedule an appointment.  Take antibiotics as prescribed and complete the full course.  Splint applied today is just to protect the hand, you may remove the splint to clean the hand as needed with mild soap on a Q-tip.  Do not submerge the hand.  Keep wound clean and dry.

## 2022-06-24 NOTE — Progress Notes (Signed)
Orthopedic Tech Progress Note Patient Details:  Janet Wise 1976-04-07 [id number]  Ortho Devices Type of Ortho Device: Volar splint Ortho Device/Splint Location: RUE Ortho Device/Splint Interventions: Application   Post Interventions Patient Tolerated: Well  [name] 06/24/2022, 2:11 PM

## 2022-06-24 NOTE — ED Provider Notes (Addendum)
[facility] EMERGENCY DEPARTMENT Provider Note   CSN: [id number] Arrival date & time: 06/24/22  1740     History  Chief Complaint  Patient presents with   Laceration    Janet Wise is a 46 y.o. female.  46 year old female presents with laceration to her right/dominant hand.  Patient states that she was trying to use a tool to pry open her car window after accidentally locking the keys in the car.  The window broke resulting in a large laceration to the dorsum of her right hand.  Last tetanus unknown.  No other injuries.       Home Medications Prior to Admission medications   Medication Sig Start Date End Date Taking? Authorizing Provider  cephALEXin (KEFLEX) 500 MG capsule Take 1 capsule (500 mg total) by mouth 4 (four) times daily for 7 days. 06/24/22 07/01/22 Yes Jeannie Fend, PA-C  benzonatate (TESSALON) 100 MG capsule Take 1 capsule (100 mg total) by mouth every 8 (eight) hours. 01/24/18   Hedges, Tinnie Gens, PA-C  hydrochlorothiazide (HYDRODIURIL) 12.5 MG tablet Take 1 tablet (12.5 mg total) by mouth daily. 03/23/16   Beaulah Dinning, MD  loratadine (CLARITIN) 10 MG tablet Take 1 tablet (10 mg total) by mouth daily. 01/24/18   Hedges, Tinnie Gens, PA-C  methocarbamol (ROBAXIN) 500 MG tablet Take 1 tablet (500 mg total) by mouth at bedtime as needed for muscle spasms. 02/09/17   Mathews Robinsons B, PA-C  naproxen (NAPROSYN) 500 MG tablet Take 1 tablet (500 mg total) by mouth 2 (two) times daily with a meal. 02/09/17   Mathews Robinsons B, PA-C  ondansetron (ZOFRAN) 4 MG tablet Take 1 tablet (4 mg total) by mouth every 6 (six) hours. 01/24/18   Eyvonne Mechanic, PA-C      Allergies    Patient has no known allergies.    Review of Systems   Review of Systems Negative except as per HPI Physical Exam Updated Vital Signs BP (!) 171/117 (BP Location: Left Arm)   Pulse 84   Temp 97.6 F (36.4 C) (Oral)   Resp 17   Wt 87.1 kg   SpO2 97%   BMI 32.96 kg/m   Physical Exam Vitals and nursing note reviewed.  Constitutional:      General: She is not in acute distress.    Appearance: She is well-developed. She is not diaphoretic.  HENT:     Head: Normocephalic and atraumatic.  Pulmonary:     Effort: Pulmonary effort is normal.  Musculoskeletal:        General: Swelling, tenderness and signs of injury present.     Comments: 10cm laceration to dorsum of right hand from 3rd proximal phalanx across 4th MCP with extensor tendon laceration, to 5th metacarpal area  Skin:    General: Skin is warm and dry.     Findings: No erythema or rash.  Neurological:     Mental Status: She is alert and oriented to person, place, and time.     Sensory: No sensory deficit.     Motor: Weakness present.  Psychiatric:        Behavior: Behavior normal.        ED Results / Procedures / Treatments   Labs (all labs ordered are listed, but only abnormal results are displayed) Labs Reviewed - No data to display  EKG None  Radiology DG Hand Complete Right  Result Date: 06/24/2022 CLINICAL DATA:  Right hand laceration EXAM: RIGHT HAND - COMPLETE 3+ VIEW COMPARISON:  None  Available. FINDINGS: There is no evidence of fracture or dislocation. There is no evidence of arthropathy or other focal bone abnormality. Soft tissue swelling of the hand in the region of the ring finger. Overlying bandaging material limits assessment for radiopaque foreign bodies, particularly glass foreign bodies. IMPRESSION: 1. Soft tissue swelling of the hand in the region of the ring finger. Overlying bandaging material limits assessment for radiopaque foreign bodies, particularly glass foreign bodies. 2. No fractures. Electronically Signed   By: Duanne Guess D.O.   On: 06/24/2022 09:03    Procedures .Marland KitchenLaceration Repair  Date/Time: 06/24/2022 1:52 PM  Performed by: Jeannie Fend, PA-C Authorized by: Jeannie Fend, PA-C   Consent:    Consent obtained:  Verbal   Consent given  by:  Patient   Risks discussed:  Infection, need for additional repair, nerve damage, poor cosmetic result, pain, poor wound healing, vascular damage, tendon damage and retained foreign body   Alternatives discussed:  No treatment Universal protocol:    Patient identity confirmed:  Verbally with patient Anesthesia:    Anesthesia method:  Local infiltration   Local anesthetic:  Lidocaine 1% WITH epi Laceration details:    Location:  Hand   Hand location:  R hand, dorsum   Length (cm):  10   Depth (mm):  7 Pre-procedure details:    Preparation:  Patient was prepped and draped in usual sterile fashion and imaging obtained to evaluate for foreign bodies Exploration:    Hemostasis achieved with:  Epinephrine   Imaging obtained: x-ray     Imaging outcome: foreign body not noted     Wound exploration: wound explored through full range of motion and entire depth of wound visualized     Wound extent: tendon damage     Wound extent: no foreign body, no nerve damage and no underlying fracture     Tendon damage location:  Upper extremity   Upper extremity tendon damage location:  Finger extensor   Tendon damage extent:  Complete transection   Tendon repair plan:  Refer for evaluation Treatment:    Area cleansed with:  Saline   Amount of cleaning:  Extensive   Irrigation solution:  Sterile saline   Debridement:  Minimal   Undermining:  None Skin repair:    Repair method:  Sutures   Suture size:  4-0   Suture material:  Prolene   Suture technique:  Simple interrupted   Number of sutures:  20 Approximation:    Approximation:  Close Repair type:    Repair type:  Intermediate Post-procedure details:    Dressing:  Splint for protection   Procedure completion:  Tolerated     Medications Ordered in ED Medications  ceFAZolin (ANCEF) injection 1 g (has no administration in time range)  Tdap (BOOSTRIX) injection 0.5 mL (0.5 mLs Intramuscular Given 06/24/22 1336)   HYDROcodone-acetaminophen (NORCO/VICODIN) 5-325 MG per tablet 1 tablet (1 tablet Oral Given 06/24/22 0857)  lidocaine (XYLOCAINE) 2 % (with pres) injection 300 mg (300 mg Infiltration Given 06/24/22 1354)    ED Course/ Medical Decision Making/ A&P                           Medical Decision Making Risk Prescription drug management.   46 year old female presents with laceration to the right hand.  Found to have approximate 10 cm complex laceration with extensor tendon laceration involving the fourth finger at the MCP.  X-ray is negative for obvious retained foreign  body or fracture, agree with radiologist interpretation.  Case was discussed with Earney Hamburg, PA-C on-call with Ortho who has discussed with Dr. Yehuda Budd with hand.  Plan is to irrigate, close wound, splint.  Tdap updated, administer Ancef, discharged with Keflex with plan to follow-up with hand.  Wound was anesthetized, irrigated, closed without complications.  Splinted prior to discharge.        Final Clinical Impression(s) / ED Diagnoses Final diagnoses:  Laceration of right hand without foreign body, initial encounter  Extensor tendon laceration, finger, open wound, initial encounter    Rx / DC Orders ED Discharge Orders          Ordered    cephALEXin (KEFLEX) 500 MG capsule  4 times daily        06/24/22 1355              Jeannie Fend, PA-C 06/24/22 1357    Jeannie Fend, PA-C 06/24/22 1357    Lonell Grandchild, MD 06/24/22 [phone]

## 2022-06-24 NOTE — ED Triage Notes (Addendum)
Large laceration to R hand from using a hammer to attempt to gain entry to a car she was locked out of. EMS reports 8in laceration to radial side of hand and wrist. VS with EMS: 180BP palp, 96bpm, 98%. Unknown last tdap

## 2022-06-24 NOTE — Consult Note (Signed)
Reason for Consult:Right hand lac Referring Physician: Alvino Blood Time called: 1137 Time at bedside: 1225   Janet Wise is an 46 y.o. female.  HPI: Janet Wise was trying to force her car window down when it shattered and cut her right hand. She was brought to the ED and hand surgery was consulted. 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  Past Medical History:  Diagnosis Date   Hypertension 2012    History reviewed. No pertinent surgical history.  Family History  Problem Relation Age of Onset   Diabetes Maternal Grandmother    Hypertension Maternal Grandmother    Diabetes Paternal Grandmother    Hypertension Paternal Grandmother     Social History:  reports that she has been smoking cigarettes. She has a 1.00 pack-year smoking history. She has never used smokeless tobacco. She reports that she does not drink alcohol and does not use drugs.  Allergies: No Known Allergies  Medications: I have reviewed the patient's current medications.  No results found for this or any previous visit (from the past 48 hour(s)).  DG Hand Complete Right  Result Date: 06/24/2022 CLINICAL DATA:  Right hand laceration EXAM: RIGHT HAND - COMPLETE 3+ VIEW COMPARISON:  None Available. FINDINGS: There is no evidence of fracture or dislocation. There is no evidence of arthropathy or other focal bone abnormality. Soft tissue swelling of the hand in the region of the ring finger. Overlying bandaging material limits assessment for radiopaque foreign bodies, particularly glass foreign bodies. IMPRESSION: 1. Soft tissue swelling of the hand in the region of the ring finger. Overlying bandaging material limits assessment for radiopaque foreign bodies, particularly glass foreign bodies. 2. No fractures. Electronically Signed   By: Duanne Guess D.O.   On: 06/24/2022 09:03    Review of Systems  HENT:  Negative for ear discharge, ear pain, hearing loss and tinnitus.   Eyes:  Negative for photophobia and pain.   Respiratory:  Negative for cough and shortness of breath.   Cardiovascular:  Negative for chest pain.  Gastrointestinal:  Negative for abdominal pain, nausea and vomiting.  Genitourinary:  Negative for dysuria, flank pain, frequency and urgency.  Musculoskeletal:  Positive for arthralgias (Right hand). Negative for back pain, myalgias and neck pain.  Neurological:  Negative for dizziness and headaches.  Hematological:  Does not bruise/bleed easily.  Psychiatric/Behavioral:  The patient is not nervous/anxious.    Blood pressure (!) 171/117, pulse 84, temperature 97.6 F (36.4 C), temperature source Oral, resp. rate 17, weight 87.1 kg, SpO2 97 %. Physical Exam Constitutional:      General: She is not in acute distress.    Appearance: She is well-developed. She is not diaphoretic.  HENT:     Head: Normocephalic and atraumatic.  Eyes:     General: No scleral icterus.       Right eye: No discharge.        Left eye: No discharge.     Conjunctiva/sclera: Conjunctivae normal.  Cardiovascular:     Rate and Rhythm: Normal rate and regular rhythm.  Pulmonary:     Effort: Pulmonary effort is normal. No respiratory distress.  Musculoskeletal:     Cervical back: Normal range of motion.     Comments: Right shoulder, elbow, wrist, digits- Large dorsal laceration from long finger DIP extending to proximal 5th MCP, radial/ulnar sensation intact 3-5 digtis, cap refill <2s, 5/5 ext at DIP/PIP joints all affected digits, no instability, no blocks to motion  Sens  Ax/R/M/U intact  Mot  Ax/ R/ PIN/ M/ AIN/ U intact  Rad 2+  Skin:    General: Skin is warm and dry.  Neurological:     Mental Status: She is alert.  Psychiatric:        Mood and Affect: Mood normal.        Behavior: Behavior normal.     Assessment/Plan: Right hand lac -- With sensation and strength intact will let EDPA I&D and close. Home on Keflex and f/u with Dr. Yehuda Budd next week.    Freeman Caldron, PA-C Orthopedic  Surgery [phone] 06/24/2022, 12:57 PM

## 2022-07-02 DIAGNOSIS — M79641 Pain in right hand: Secondary | ICD-10-CM | POA: Diagnosis not present

## 2022-07-09 DIAGNOSIS — M79641 Pain in right hand: Secondary | ICD-10-CM | POA: Diagnosis not present

## 2022-08-28 ENCOUNTER — Ambulatory Visit: Payer: BC Managed Care – PPO | Admitting: Internal Medicine

## 2022-08-31 ENCOUNTER — Ambulatory Visit: Payer: BC Managed Care – PPO | Admitting: Internal Medicine

## 2022-08-31 ENCOUNTER — Encounter: Payer: Self-pay | Admitting: Internal Medicine

## 2022-08-31 VITALS — BP 151/102 | HR 88 | Ht 65.0 in | Wt 200.0 lb

## 2022-08-31 DIAGNOSIS — R9431 Abnormal electrocardiogram [ECG] [EKG]: Secondary | ICD-10-CM | POA: Diagnosis not present

## 2022-08-31 DIAGNOSIS — I119 Hypertensive heart disease without heart failure: Secondary | ICD-10-CM | POA: Diagnosis not present

## 2022-08-31 DIAGNOSIS — I10 Essential (primary) hypertension: Secondary | ICD-10-CM | POA: Diagnosis not present

## 2022-08-31 MED ORDER — AMLODIPINE BESYLATE 10 MG PO TABS: 10.0000 mg | ORAL_TABLET | Freq: Every day | ORAL | 3 refills | Status: DC

## 2022-08-31 NOTE — Progress Notes (Signed)
Primary Physician/Referring:  No primary care provider on file.  Patient ID: Janet Wise, female    DOB: Nov 18, 1975, 47 y.o.   MRN: [id number]  Chief Complaint  Patient presents with   Hypertension   New Patient (Initial Visit)   HPI:    Janet Wise  is a 47 y.o. female with uncontrolled hypertension who is here to establish care with cardiology. She was sent here by here GI doctor because her pressure was too high to perform procedures. She was on HCTZ 12.5 mg many years ago but she no longer takes anything for her blood pressure. Patient is ready to get healthy and start taking better care of her body. She does smoke cigarettes and she has quit in the past but started smoking again. She wants to quit again but she is not quite ready right now. Patient denies cardiac history in herself. Patient denies chest pain, shortness of breath, palpitations, diaphoresis, syncope, edema, PND, orthopnea.   Past Medical History:  Diagnosis Date   Hypertension 2012   No past surgical history on file. Family History  Problem Relation Age of Onset   Heart attack Father    Diabetes Maternal Grandmother    Hypertension Maternal Grandmother    Diabetes Paternal Grandmother    Hypertension Paternal Grandmother     Social History   Tobacco Use   Smoking status: Every Day    Packs/day: 0.25    Years: 4.00    Total pack years: 1.00    Types: Cigars, Cigarettes   Smokeless tobacco: Never  Substance Use Topics   Alcohol use: No   Marital Status: Divorced  ROS  Review of Systems  Cardiovascular:  Negative for chest pain, claudication, irregular heartbeat, leg swelling, orthopnea, palpitations and syncope.   Objective  Blood pressure (!) 151/102, pulse 88, height '5\' 5"'$  (1.651 m), weight 200 lb (90.7 kg), SpO2 98 %. Body mass index is 33.28 kg/m.     08/31/2022    3:11 PM 08/31/2022    3:05 PM 06/24/2022    2:34 PM  Vitals with BMI  Height  '5\' 5"'$    Weight  200 lbs   BMI  [id number]    Systolic [id number] 0000000 [id number]  Diastolic [id number] 82  Pulse 88 89 78     Physical Exam Vitals reviewed.  HENT:     Head: Normocephalic.  Cardiovascular:     Rate and Rhythm: Normal rate and regular rhythm.     Pulses: Normal pulses.     Heart sounds: Normal heart sounds. No murmur heard. Pulmonary:     Effort: Pulmonary effort is normal.     Breath sounds: Normal breath sounds.  Abdominal:     General: Bowel sounds are normal.  Musculoskeletal:     Right lower leg: No edema.     Left lower leg: No edema.  Skin:    General: Skin is warm and dry.  Neurological:     Mental Status: She is alert.     Medications and allergies  No Known Allergies   Medication list after today's encounter   Current Outpatient Medications:    amLODipine (NORVASC) 10 MG tablet, Take 1 tablet (10 mg total) by mouth daily., Disp: 90 tablet, Rfl: 3  Laboratory examination:   Lab Results  Component Value Date   NA 138 01/24/2018   K 4.6 01/24/2018   CO2 26 01/24/2018   GLUCOSE 107 (H) 01/24/2018   BUN 13 01/24/2018   CREATININE 0.67 01/24/2018  CALCIUM 9.1 01/24/2018   GFRNONAA >60 01/24/2018       Latest Ref Rng & Units 01/24/2018    3:05 PM 03/18/2016    2:37 PM  CMP  Glucose 70 - 99 mg/dL 107  92   BUN 6 - 20 mg/dL 13  8   Creatinine 0.44 - 1.00 mg/dL 0.67  0.78   Sodium 135 - 145 mmol/L 138  135   Potassium 3.5 - 5.1 mmol/L 4.6  4.3   Chloride 98 - 111 mmol/L 104  102   CO2 22 - 32 mmol/L 26  25   Calcium 8.9 - 10.3 mg/dL 9.1  9.1   Total Protein 6.5 - 8.1 g/dL 7.4  7.5   Total Bilirubin 0.3 - 1.2 mg/dL 0.4  0.2   Alkaline Phos 38 - 126 U/L 59  59   AST 15 - 41 U/L 20  11   ALT 0 - 44 U/L 16  6       Latest Ref Rng & Units 01/24/2018    3:05 PM 03/18/2016    2:37 PM  CBC  WBC 4.0 - 10.5 K/uL 6.7  7.1   Hemoglobin 12.0 - 15.0 g/dL 10.8  10.3   Hematocrit 36.0 - 46.0 % 36.7  34.8   Platelets 150 - 400 K/uL 381  395     Lipid Panel No results for input(s): "CHOL", "TRIG",  "LDLCALC", "VLDL", "HDL", "CHOLHDL", "LDLDIRECT" in the last 8760 hours.  HEMOGLOBIN A1C Lab Results  Component Value Date   HGBA1C 4.8 02/06/2016   TSH No results for input(s): "TSH" in the last 8760 hours.  External labs:     Radiology:    Cardiac Studies:     EKG:   08/31/2022: Sinus Rhythm with LAE. Voltage criteria for LVH with T wave abnormality 2/2 hypertrophy.  Assessment     ICD-10-CM   1. Essential hypertension  I10 EKG 12-Lead    ECHOCARDIOGRAM COMPLETE    2. Nonspecific abnormal electrocardiogram (ECG) (EKG)  R94.31     3. Hypertensive left ventricular hypertrophy, without heart failure  I11.9        Orders Placed This Encounter  Procedures   EKG 12-Lead   ECHOCARDIOGRAM COMPLETE    Standing Status:   Future    Standing Expiration Date:   08/31/2023    Order Specific Question:   Where should this test be performed    Answer:   [place]    Order Specific Question:   Perflutren DEFINITY (image enhancing agent) should be administered unless hypersensitivity or allergy exist    Answer:   Administer Perflutren    Order Specific Question:   Is a special reader required? (athlete or structural heart)    Answer:   No    Order Specific Question:   Does this study need to be read by the Structural team/Level 3 readers?    Answer:   No    Order Specific Question:   Reason for exam-Echo    Answer:   Abnormal ECG  R94.31    Meds ordered this encounter  Medications   amLODipine (NORVASC) 10 MG tablet    Sig: Take 1 tablet (10 mg total) by mouth daily.    Dispense:  90 tablet    Refill:  3    Medications Discontinued During This Encounter  Medication Reason   benzonatate (TESSALON) 100 MG capsule Completed Course   hydrochlorothiazide (HYDRODIURIL) 12.5 MG tablet Completed Course   loratadine (CLARITIN) 10 MG tablet  Completed Course   methocarbamol (ROBAXIN) 500 MG tablet Completed Course   naproxen (NAPROSYN) 500 MG tablet Completed Course    ondansetron (ZOFRAN) 4 MG tablet Completed Course   ibuprofen (ADVIL) 200 MG tablet      Recommendations:   Janet Wise is a 47 y.o.  female with uncontrolled HTN  Essential hypertension Currently not on anti-hypertensive meds Stop ibuprofen as this will elevate BP Start amlodipine 10 mg qDay and keep BP log Encourage low-sodium diet, less than 2000 mg daily. Follow-up in 1-2 months or sooner if needed.   Nonspecific abnormal electrocardiogram (ECG)  Hypertensive left ventricular hypertrophy, without heart failure Echocardiogram ordered LVH on today's EKG No clinical signs of heart failure     Janet Flock, DO, [facility]  08/31/2022, 3:27 PM Office: [phone] Pager: [phone]

## 2022-10-07 ENCOUNTER — Ambulatory Visit (HOSPITAL_COMMUNITY): Payer: BC Managed Care – PPO

## 2022-10-21 ENCOUNTER — Ambulatory Visit (HOSPITAL_COMMUNITY)
Admission: RE | Admit: 2022-10-21 | Discharge: 2022-10-21 | Disposition: A | Discharge status: Home / Self Care | Payer: BC Managed Care – PPO | Source: Ambulatory Visit | Attending: Internal Medicine | Admitting: Internal Medicine

## 2022-10-21 DIAGNOSIS — I10 Essential (primary) hypertension: Secondary | ICD-10-CM

## 2022-10-21 DIAGNOSIS — I119 Hypertensive heart disease without heart failure: Secondary | ICD-10-CM | POA: Insufficient documentation

## 2022-10-21 DIAGNOSIS — R9431 Abnormal electrocardiogram [ECG] [EKG]: Secondary | ICD-10-CM | POA: Diagnosis not present

## 2022-10-21 DIAGNOSIS — I351 Nonrheumatic aortic (valve) insufficiency: Secondary | ICD-10-CM | POA: Diagnosis not present

## 2022-10-21 LAB — ECHOCARDIOGRAM COMPLETE
AV Vena cont: 0.4 cm
Area-P 1/2: 3.72 cm2
Calc EF: 54.2 %
P 1/2 time: 382 msec
S' Lateral: 4.1 cm
Single Plane A2C EF: 48.2 %
Single Plane A4C EF: 57.6 %

## 2022-10-21 NOTE — Progress Notes (Signed)
Echocardiogram 2D Echocardiogram has been performed.  Janet Wise 10/21/2022, 3:42 PM

## 2023-01-14 DIAGNOSIS — Z1159 Encounter for screening for other viral diseases: Secondary | ICD-10-CM | POA: Diagnosis not present

## 2023-01-14 DIAGNOSIS — Z01419 Encounter for gynecological examination (general) (routine) without abnormal findings: Secondary | ICD-10-CM | POA: Diagnosis not present

## 2023-01-14 DIAGNOSIS — Z114 Encounter for screening for human immunodeficiency virus [HIV]: Secondary | ICD-10-CM | POA: Diagnosis not present

## 2023-01-25 DIAGNOSIS — Z0000 Encounter for general adult medical examination without abnormal findings: Secondary | ICD-10-CM | POA: Diagnosis not present

## 2023-01-25 DIAGNOSIS — Z131 Encounter for screening for diabetes mellitus: Secondary | ICD-10-CM | POA: Diagnosis not present

## 2023-01-25 DIAGNOSIS — Z130 Encounter for screening for diseases of the blood and blood-forming organs and certain disorders involving the immune mechanism: Secondary | ICD-10-CM | POA: Diagnosis not present

## 2023-01-25 DIAGNOSIS — Z13220 Encounter for screening for lipoid disorders: Secondary | ICD-10-CM | POA: Diagnosis not present

## 2023-02-01 DIAGNOSIS — R87810 Cervical high risk human papillomavirus (HPV) DNA test positive: Secondary | ICD-10-CM | POA: Diagnosis not present

## 2023-02-06 DIAGNOSIS — Z1231 Encounter for screening mammogram for malignant neoplasm of breast: Secondary | ICD-10-CM | POA: Diagnosis not present

## 2023-03-23 ENCOUNTER — Other Ambulatory Visit: Payer: Self-pay | Admitting: Internal Medicine

## 2023-03-23 DIAGNOSIS — I10 Essential (primary) hypertension: Secondary | ICD-10-CM

## 2023-03-24 ENCOUNTER — Encounter: Payer: Self-pay | Admitting: Cardiology

## 2023-03-24 MED ORDER — AMLODIPINE BESYLATE 10 MG PO TABS: 10.0000 mg | ORAL_TABLET | Freq: Every day | ORAL | 0 refills | Status: DC

## 2023-03-24 NOTE — Telephone Encounter (Signed)
Refill for amlodipine 10 mg has been sent, MyChart message to the patient to make appointment for further refills or to establish with a PCP for follow-up has been done, also sent message to pharmacy on Rx regarding making appointment.    ICD-10-CM   1. Hypertension, unspecified type  I10 amLODipine (NORVASC) 10 MG tablet     Medications Discontinued During This Encounter  Medication Reason   amLODipine (NORVASC) 10 MG tablet Reorder   Meds ordered this encounter  Medications   amLODipine (NORVASC) 10 MG tablet    Sig: Take 1 tablet (10 mg total) by mouth daily.    Dispense:  90 tablet    Refill:  0    Patient needs appointment for refills or can establish with PCP

## 2023-06-28 ENCOUNTER — Other Ambulatory Visit: Payer: Self-pay

## 2023-06-28 ENCOUNTER — Emergency Department (HOSPITAL_COMMUNITY)
Admission: EM | Admit: 2023-06-28 | Discharge: 2023-06-28 | Disposition: A | Discharge status: Home / Self Care | Payer: BC Managed Care – PPO | Attending: Emergency Medicine | Admitting: Emergency Medicine

## 2023-06-28 ENCOUNTER — Encounter (HOSPITAL_COMMUNITY): Payer: Self-pay

## 2023-06-28 DIAGNOSIS — R58 Hemorrhage, not elsewhere classified: Secondary | ICD-10-CM | POA: Diagnosis not present

## 2023-06-28 DIAGNOSIS — I10 Essential (primary) hypertension: Secondary | ICD-10-CM | POA: Diagnosis not present

## 2023-06-28 DIAGNOSIS — Z79899 Other long term (current) drug therapy: Secondary | ICD-10-CM | POA: Insufficient documentation

## 2023-06-28 DIAGNOSIS — F17210 Nicotine dependence, cigarettes, uncomplicated: Secondary | ICD-10-CM | POA: Insufficient documentation

## 2023-06-28 DIAGNOSIS — R040 Epistaxis: Secondary | ICD-10-CM | POA: Insufficient documentation

## 2023-06-28 MED ORDER — AMLODIPINE BESYLATE 5 MG PO TABS
10.0000 mg | ORAL_TABLET | Freq: Once | ORAL | Status: DC
  Filled 2023-06-28: qty 2

## 2023-06-28 MED ORDER — OXYMETAZOLINE HCL 0.05 % NA SOLN
1.0000 | Freq: Once | NASAL | Status: AC
  Administered 2023-06-28: 1 via NASAL
  Filled 2023-06-28: qty 30

## 2023-06-28 MED ORDER — LIDOCAINE-EPINEPHRINE 1 %-1:100000 IJ SOLN
10.0000 mL | Freq: Once | INTRAMUSCULAR | Status: AC
Start: 2023-06-28 — End: 2023-06-28
  Administered 2023-06-28: 10 mL
  Filled 2023-06-28: qty 1

## 2023-06-28 MED ORDER — ONDANSETRON 4 MG PO TBDP
4.0000 mg | ORAL_TABLET | Freq: Once | ORAL | Status: AC
  Administered 2023-06-28: 4 mg via ORAL
  Filled 2023-06-28: qty 1

## 2023-06-28 NOTE — ED Provider Notes (Signed)
[place] EMERGENCY DEPARTMENT AT [facility] Provider Note  CSN: [id number] Arrival date & time: 06/28/23 5284  Chief Complaint(s) Epistaxis  HPI Janet Wise is a 47 y.o. female with past medical history as below, significant for hypertension, tobacco abuse who presents to the ED with complaint of epistaxis  Patient reports woke up from sleep with nosebleed.  She Denies any trauma or instrumentation.  She applied direct pressure without improvement.  EMS instilled Afrin without improvement.  She reports the ED with ongoing nosebleed.  He has some nausea, has been swallowing blood.  Past Medical History Past Medical History:  Diagnosis Date   Hypertension 2012   Patient Active Problem List   Diagnosis Date Noted   Dysfunctional uterine bleeding 03/18/2016   Healthcare maintenance 03/18/2016   Essential hypertension 02/16/2016   Tobacco abuse counseling 02/16/2016   Home Medication(s) Prior to Admission medications   Medication Sig Start Date End Date Taking? Authorizing Provider  amLODipine (NORVASC) 10 MG tablet Take 1 tablet (10 mg total) by mouth daily. 03/24/23 03/18/24  Yates Decamp, MD                                                                                                                                    Past Surgical History History reviewed. No pertinent surgical history. Family History Family History  Problem Relation Age of Onset   Heart attack Father    Diabetes Maternal Grandmother    Hypertension Maternal Grandmother    Diabetes Paternal Grandmother    Hypertension Paternal Grandmother     Social History Social History   Tobacco Use   Smoking status: Every Day    Current packs/day: 0.25    Average packs/day: 0.3 packs/day for 4.0 years (1.0 ttl pk-yrs)    Types: Cigars, Cigarettes   Smokeless tobacco: Never  Substance Use Topics   Alcohol use: No   Drug use: No   Allergies Patient has no known allergies.  Review of  Systems Review of Systems  Constitutional:  Negative for chills and fever.  HENT:  Positive for nosebleeds. Negative for congestion.   Respiratory:  Negative for chest tightness and shortness of breath.   Gastrointestinal:  Positive for nausea and vomiting. Negative for abdominal pain.  Genitourinary:  Negative for dyspareunia.  Neurological:  Negative for syncope.  All other systems reviewed and are negative.   Physical Exam Vital Signs  I have reviewed the triage vital signs BP (!) 100/55 (BP Location: Right Arm) Comment: manual bp  Pulse 91   Temp 97.8 F (36.6 C) (Oral)   Resp (!) 24   SpO2 94%  Physical Exam Vitals and nursing note reviewed.  Constitutional:      General: She is not in acute distress.    Appearance: Normal appearance. She is well-developed. She is not ill-appearing.  HENT:     Head: Normocephalic and atraumatic.     Right Ear: External  ear normal.     Left Ear: External ear normal.     Nose: Nose normal.     Comments: Epistaxis noted left nare Blood noted posterior oropharynx    Mouth/Throat:     Mouth: Mucous membranes are moist.  Eyes:     General: No scleral icterus.       Right eye: No discharge.        Left eye: No discharge.  Cardiovascular:     Rate and Rhythm: Normal rate.  Pulmonary:     Effort: Pulmonary effort is normal. No respiratory distress.     Breath sounds: No stridor.  Abdominal:     General: Abdomen is flat. There is no distension.     Tenderness: There is no guarding.  Musculoskeletal:        General: No deformity.     Cervical back: No rigidity.  Skin:    General: Skin is warm and dry.     Coloration: Skin is not cyanotic, jaundiced or pale.  Neurological:     Mental Status: She is alert and oriented to person, place, and time.     GCS: GCS eye subscore is 4. GCS verbal subscore is 5. GCS motor subscore is 6.  Psychiatric:        Speech: Speech normal.        Behavior: Behavior normal. Behavior is cooperative.      ED Results and Treatments Labs (all labs ordered are listed, but only abnormal results are displayed) Labs Reviewed - No data to display                                                                                                                        Radiology No results found.  Pertinent labs & imaging results that were available during my care of the patient were reviewed by me and considered in my medical decision making (see MDM for details).  Medications Ordered in ED Medications  oxymetazoline (AFRIN) 0.05 % nasal spray 1 spray (1 spray Each Nare Given by Other 06/28/23 0233)  lidocaine-EPINEPHrine (XYLOCAINE W/EPI) 1 %-1:100000 (with pres) injection 10 mL (10 mLs Other Given by Other 06/28/23 0233)  ondansetron (ZOFRAN-ODT) disintegrating tablet 4 mg (4 mg Oral Given 06/28/23 0245)  Procedures Epistaxis Management  Date/Time: 06/28/2023 3:15 AM  Performed by: Sloan Leiter, DO Authorized by: Sloan Leiter, DO   Consent:    Consent obtained:  Verbal   Consent given by:  Patient   Risks, benefits, and alternatives were discussed: yes     Risks discussed:  Bleeding and infection   Alternatives discussed:  No treatment and delayed treatment Universal protocol:    Procedure explained and questions answered to patient or proxy's satisfaction: yes     Immediately prior to procedure, a time out was called: yes     Patient identity confirmed:  Verbally with patient and arm band Anesthesia:    Anesthesia method:  None Procedure details:    Treatment site:  L anterior   Treatment method:  Anterior pack   Treatment episode: initial   Post-procedure details:    Assessment:  Bleeding stopped   Procedure completion:  Tolerated well, no immediate complications Comments:     Lido w/ epi, afrin   (including critical care time)  Medical  Decision Making / ED Course    Medical Decision Making:    [name] is a 47 y.o. female with past medical history as below, significant for hypertension, tobacco abuse who presents to the ED with complaint of epistaxis. The complaint involves an extensive differential diagnosis and also carries with it a high risk of complications and morbidity.  Serious etiology was considered. Ddx includes but is not limited to: Anterior epistaxis, posterior epistaxis, AVM, trauma, etc.  Complete initial physical exam performed, notably the patient was in no distress, she is very nauseated.    Reviewed and confirmed nursing documentation for past medical history, family history, social history.  Vital signs reviewed.      Clinical Course as of 06/28/23 0319  Mon Jun 28, 2023  0246 Nosebleed resolved  [SG]    Clinical Course User Index [SG] Sloan Leiter, DO    Brief summary: 47 year old female here with epistaxis, history of hypertension is not been taking her antihypertensive.  Epistaxis noted on arrival to left nares only.  Posterior oropharynx blood was also noted.  She vomited during my assessment.  After vomiting reports that she is feeling much better.  Epistaxis treatment was initiated which resulted in resolution of the nosebleed.  She is feeling much better, given epistaxis precautions for home, supportive care encouraged at home.  Advised her to resume her amlodipine.  Follow-up PCP  The patient improved significantly and was discharged in stable condition. Detailed discussions were had with the patient regarding current findings, and need for close f/u with PCP or on call doctor. The patient has been instructed to return immediately if the symptoms worsen in any way for re-evaluation. Patient verbalized understanding and is in agreement with current care plan. All questions answered prior to discharge.                Additional history obtained: -Additional history  obtained from EMS -External records from outside source obtained and reviewed including: Chart review including previous notes, labs, imaging, consultation notes including  Home med list, cards office documentation, prior ED visits   Lab Tests: na  EKG   EKG Interpretation Date/Time:    Ventricular Rate:    PR Interval:    QRS Duration:    QT Interval:    QTC Calculation:   R Axis:      Text Interpretation:           Imaging Studies ordered: na  Medicines ordered and prescription drug management: Meds ordered this encounter  Medications   oxymetazoline (AFRIN) 0.05 % nasal spray 1 spray   lidocaine-EPINEPHrine (XYLOCAINE W/EPI) 1 %-1:100000 (with pres) injection 10 mL   DISCONTD: amLODipine (NORVASC) tablet 10 mg   ondansetron (ZOFRAN-ODT) disintegrating tablet 4 mg    -I have reviewed the patients home medicines and have made adjustments as needed   Consultations Obtained: na   Cardiac Monitoring: Continuous pulse oximetry interpreted by myself, 99% on RA.    Social Determinants of Health:  Diagnosis or treatment significantly limited by social determinants of health: current smoker Counseled patient for approximately 3 minutes regarding smoking cessation. Discussed risks of smoking and how they applied and affected their visit here today. Patient not ready to quit at this time, however will follow up with their primary doctor when they are.   CPT code: 25366: intermediate counseling for smoking cessation     Reevaluation: After the interventions noted above, I reevaluated the patient and found that they have resolved  Co morbidities that complicate the patient evaluation  Past Medical History:  Diagnosis Date   Hypertension 2012      Dispostion: Disposition decision including need for hospitalization was considered, and patient discharged from emergency department.    Final Clinical Impression(s) / ED Diagnoses Final diagnoses:  Epistaxis         Sloan Leiter, DO 06/28/23 4403

## 2023-06-28 NOTE — ED Notes (Signed)
Pt vomiting at this time. EDP at bedside.

## 2023-06-28 NOTE — ED Triage Notes (Signed)
Pt bib [facility] coming from home with uncontrolled epistaxis. Pt is hypertensive and has not taken her medications. Pt reports that she was sleeping and woke up with her nose bleeding in large amount. Pt reports trying cold pressure. EMS states they gave afrin. Pt reports they she blew out clots but EMS states they have not seen any. Pt denies headache, n/v. Pt alert and oriented x4.    EMS vitals: 194/96 100 HR 96% RA

## 2023-06-28 NOTE — ED Notes (Signed)
Pt in no acute distress at this time. Regular/even respirations. Pt alert and oriented x4.

## 2023-06-28 NOTE — ED Notes (Signed)
110/79 96% O2 75 HR 16 RR

## 2023-06-28 NOTE — Discharge Instructions (Addendum)
It was a pleasure caring for you today in the emergency department.   As we discussed, there are several techniques you can use to prevent or stop nosebleeds in the future.  Keep your nose moist either with saline spray several times a day or by applying a thin layer of Neosporin, bacitracin, or other antibiotic ointment to the inside of your nose once or twice a day.  Consider using a humidifier in your bedroom at nighttime.    If the bleeding starts up again, gently blow your nose into a tissue to clear the blood and clots, then apply 1-2 sprays to each affected nostril of over-the-counter Afrin nasal spray (oxymetazoline).   Then squeeze your nose shut tightly and DO NOT PEEK for at least 15 minutes.  This will resolve most nosebleeds.  If you continue to have trouble after trying these techniques, or anything seems out of the ordinary or concerns you, please return tot he Emergency Department.  Please return to the emergency department for any worsening or worrisome symptoms.

## 2023-06-30 ENCOUNTER — Encounter (HOSPITAL_COMMUNITY): Payer: Self-pay | Admitting: Emergency Medicine

## 2023-06-30 ENCOUNTER — Other Ambulatory Visit: Payer: Self-pay

## 2023-06-30 ENCOUNTER — Emergency Department (HOSPITAL_COMMUNITY)
Admission: EM | Admit: 2023-06-30 | Discharge: 2023-06-30 | Disposition: A | Discharge status: Home / Self Care | Payer: BC Managed Care – PPO

## 2023-06-30 DIAGNOSIS — R531 Weakness: Secondary | ICD-10-CM | POA: Diagnosis not present

## 2023-06-30 DIAGNOSIS — R040 Epistaxis: Secondary | ICD-10-CM

## 2023-06-30 MED ORDER — OXYMETAZOLINE HCL 0.05 % NA SOLN: 1.0000 | Freq: Two times a day (BID) | NASAL | 0 refills | Status: AC

## 2023-06-30 MED ORDER — OXYMETAZOLINE HCL 0.05 % NA SOLN
2.0000 | Freq: Once | NASAL | Status: AC
Start: 2023-06-30 — End: 2023-06-30
  Administered 2023-06-30: 2 via NASAL
  Filled 2023-06-30: qty 30

## 2023-06-30 NOTE — ED Triage Notes (Signed)
 Pt arrives EMS from home with nosebleed since last night. Pt with large clots. States feels dizzy/lightheaded. Pt tachy and hypotensive. Mentating well.

## 2023-06-30 NOTE — Discharge Instructions (Signed)
 As we discussed, blow nose then pinch and hold pressure for 10 minutes.  If bleeding persists use Afrin and hold nose again for 10 minutes.  If bleeding persists thereafter please return to the emergency department.  You may also return if develop sudden onset headache, vision changes, lightheadedness, dizziness, chest pain, shortness of breath, passout or any new or worsening symptoms that are concerning to you.

## 2023-06-30 NOTE — ED Provider Notes (Signed)
 [place] EMERGENCY DEPARTMENT AT [facility] Provider Note   CSN: [id number] Arrival date & time: 06/30/23  9145     History  Chief Complaint  Patient presents with   Epistaxis    Janet Wise is a 48 y.o. female.  48 year old female presenting emergency department for nosebleed.  Seen recently in the emergency department for the same.  She reports that she had some bleeding last night but was able to get it to stop.  Awoke this morning again to bleeding.  She could not get it to stop.  Does not take blood thinners.   Epistaxis      Home Medications Prior to Admission medications   Medication Sig Start Date End Date Taking? Authorizing Provider  amLODipine  (NORVASC ) 10 MG tablet Take 1 tablet (10 mg total) by mouth daily. 03/24/23 03/18/24  Ladona Heinz, MD      Allergies    Patient has no known allergies.    Review of Systems   Review of Systems  HENT:  Positive for nosebleeds.     Physical Exam Updated Vital Signs BP (!) 117/94   Pulse 61   Temp 98.5 F (36.9 C) (Oral)   Resp 17   Ht 5' 5 (1.651 m)   Wt 90.7 kg   SpO2 95%   BMI 33.27 kg/m  Physical Exam Vitals and nursing note reviewed.  Constitutional:      General: She is not in acute distress. HENT:     Head: Normocephalic.     Nose:     Comments: Blood and clot in left nare    Mouth/Throat:     Mouth: Mucous membranes are moist.  Eyes:     Conjunctiva/sclera: Conjunctivae normal.  Cardiovascular:     Rate and Rhythm: Normal rate and regular rhythm.  Pulmonary:     Effort: Pulmonary effort is normal.  Abdominal:     General: Abdomen is flat.  Psychiatric:        Mood and Affect: Mood normal.     ED Results / Procedures / Treatments   Labs (all labs ordered are listed, but only abnormal results are displayed) Labs Reviewed - No data to display  EKG None  Radiology No results found.  Procedures Procedures    Medications Ordered in ED Medications  oxymetazoline   (AFRIN) 0.05 % nasal spray 2 spray (2 sprays Each Nare Given 06/30/23 0931)    ED Course/ Medical Decision Making/ A&P Clinical Course as of 06/30/23 0956  Wed Jun 30, 2023  0952 Bleeding is stopped with pressure and Afrin.  Vital signs normalized.  Offered to observe patient for rebleeding.  She declined and would like to be discharged at this time.  Discussed supportive care.  Will give ENT follow-up as needed.  Stable for discharge at this time. [TY]    Clinical Course User Index [TY] Neysa Caron PARAS, DO                                 Medical Decision Making Well-appearing 48 year old female presenting emergency department for epistaxis out of left nare.  She is afebrile initially tachycardic, but hemodynamically stable.  Appears to be anterior as I do not appreciate any blood down posterior oropharynx.  No readily identifiable source of bleeding however on exam.  Patient instructed to hold pressure, then was given Afrin and continue to hold pressure.  Will reevaluate.  See ED course for further  MDM disposition.  Amount and/or Complexity of Data Reviewed External Data Reviewed:     Details: Prior ED visit did require anterior nasal packing. Labs:     Details: Consider labs given tachycardia, however heart rate normalized  Risk OTC drugs.           Final Clinical Impression(s) / ED Diagnoses Final diagnoses:  None    Rx / DC Orders ED Discharge Orders     None         Neysa Caron PARAS, DO 06/30/23 9043

## 2023-07-28 ENCOUNTER — Ambulatory Visit: Payer: BC Managed Care – PPO | Admitting: Nurse Practitioner

## 2023-07-28 ENCOUNTER — Encounter: Payer: Self-pay | Admitting: Nurse Practitioner

## 2023-07-28 VITALS — BP 156/100 | HR 104 | Temp 97.9°F | Ht 63.0 in | Wt 200.4 lb

## 2023-07-28 DIAGNOSIS — Z131 Encounter for screening for diabetes mellitus: Secondary | ICD-10-CM | POA: Diagnosis not present

## 2023-07-28 DIAGNOSIS — Z13220 Encounter for screening for lipoid disorders: Secondary | ICD-10-CM | POA: Insufficient documentation

## 2023-07-28 DIAGNOSIS — Z136 Encounter for screening for cardiovascular disorders: Secondary | ICD-10-CM | POA: Diagnosis not present

## 2023-07-28 DIAGNOSIS — Z6835 Body mass index (BMI) 35.0-35.9, adult: Secondary | ICD-10-CM | POA: Diagnosis not present

## 2023-07-28 DIAGNOSIS — M25511 Pain in right shoulder: Secondary | ICD-10-CM | POA: Diagnosis not present

## 2023-07-28 DIAGNOSIS — R040 Epistaxis: Secondary | ICD-10-CM

## 2023-07-28 DIAGNOSIS — I10 Essential (primary) hypertension: Secondary | ICD-10-CM

## 2023-07-28 DIAGNOSIS — E66812 Obesity, class 2: Secondary | ICD-10-CM

## 2023-07-28 LAB — HEMOGLOBIN A1C: Hgb A1c MFr Bld: 4.9 % (ref 4.6–6.5)

## 2023-07-28 LAB — CBC
HCT: 31.3 % — ABNORMAL LOW (ref 36.0–46.0)
Hemoglobin: 10 g/dL — ABNORMAL LOW (ref 12.0–15.0)
MCHC: 31.8 g/dL (ref 30.0–36.0)
MCV: 94.6 fL (ref 78.0–100.0)
Platelets: 367 10*3/uL (ref 150.0–400.0)
RBC: 3.31 Mil/uL — ABNORMAL LOW (ref 3.87–5.11)
RDW: 21.7 % — ABNORMAL HIGH (ref 11.5–15.5)
WBC: 6.1 10*3/uL (ref 4.0–10.5)

## 2023-07-28 LAB — LIPID PANEL
Cholesterol: 156 mg/dL (ref 0–200)
HDL: 73.1 mg/dL (ref >39.00)
LDL Cholesterol: 70 mg/dL (ref 0–99)
NonHDL: 82.8
Total CHOL/HDL Ratio: 2
Triglycerides: 63 mg/dL (ref 0.0–149.0)
VLDL: 12.6 mg/dL (ref 0.0–40.0)

## 2023-07-28 LAB — COMPREHENSIVE METABOLIC PANEL
ALT: 8 U/L (ref 0–35)
AST: 14 U/L (ref 0–37)
Albumin: 4.1 g/dL (ref 3.5–5.2)
Alkaline Phosphatase: 71 U/L (ref 39–117)
BUN: 13 mg/dL (ref 6–23)
CO2: 29 meq/L (ref 19–32)
Calcium: 9.5 mg/dL (ref 8.4–10.5)
Chloride: 102 meq/L (ref 96–112)
Creatinine, Ser: 0.8 mg/dL (ref 0.40–1.20)
GFR: 86.47 mL/min (ref >60.00)
Glucose, Bld: 88 mg/dL (ref 70–99)
Potassium: 4.1 meq/L (ref 3.5–5.1)
Sodium: 138 meq/L (ref 135–145)
Total Bilirubin: 0.4 mg/dL (ref 0.2–1.2)
Total Protein: 8.2 g/dL (ref 6.0–8.3)

## 2023-07-28 LAB — TSH: TSH: 1.03 u[IU]/mL (ref 0.35–5.50)

## 2023-07-28 MED ORDER — AMLODIPINE BESYLATE 10 MG PO TABS: 10.0000 mg | ORAL_TABLET | Freq: Every day | ORAL | 3 refills | Status: DC

## 2023-07-28 NOTE — Assessment & Plan Note (Signed)
Chronic, uncontrolled Restart amlodipine 10 mg daily by mouth Follow-up in 1 month or sooner as needed.

## 2023-07-28 NOTE — Assessment & Plan Note (Signed)
Labs ordered, further recommendations may be made based upon his results.

## 2023-07-28 NOTE — Assessment & Plan Note (Signed)
Acute No red flags noted on exam Etiology unclear, we discussed x-ray but patient declined For now we will treat with Tylenol 500 to 1000 mg 3 times daily as needed. Patient to follow-up in 1 month, if symptoms persist or worsen consider referral to sports medicine.

## 2023-07-28 NOTE — Progress Notes (Signed)
New Patient Office Visit  Subjective    Patient ID: Janet Wise, female    DOB: [birth year]  Age: 48 y.o. MRN: [id number]  CC:  Chief Complaint  Patient presents with   New Patient (Initial Visit)   Epistaxis    HPI Janet Wise presents to establish care Her main concern is that she was recently seen in the ER twice for recurrent nosebleeds.  She also has hypertension that has not been well-controlled lately.  Has not seen a PCP in a few years but wants to reestablish now.  Since being evaluated emergency department for nosebleed she has been using a humidifier at night which has resulted in no further recurrence of nosebleeds.  She is also been using Afrin as needed, but reports she is no longer taking this daily.  She has been having dizziness but no significant headaches or blurry vision.  She reports 24-month history of right shoulder pain.  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  Does not have to lift them above her head.  Has been doing this for a couple of years.  No weakness or sensory changes.  Does have history of trauma to right hand when trying to open her car window in the past and it shattered requiring around 20 stitches.  Outpatient Encounter Medications as of 07/28/2023  Medication Sig   oxymetazoline (AFRIN) 0.05 % nasal spray Place 1 spray into both nostrils 2 (two) times daily.   amLODipine (NORVASC) 10 MG tablet Take 1 tablet (10 mg total) by mouth daily.   [DISCONTINUED] amLODipine (NORVASC) 10 MG tablet Take 1 tablet (10 mg total) by mouth daily. (Patient not taking: Reported on 07/28/2023)   No facility-administered encounter medications on file as of 07/28/2023.    Past Medical History:  Diagnosis Date   Hypertension 2012    Past Surgical History:  Procedure Laterality Date   WISDOM TOOTH EXTRACTION Bilateral     Family History  Problem Relation Age of Onset   Heart attack Father    Diabetes Maternal Grandmother     Hypertension Maternal Grandmother    Diabetes Paternal Grandmother    Hypertension Paternal Grandmother     Social History   Socioeconomic History   Marital status: Divorced    Spouse name: Not on file   Number of children: Not on file   Years of education: Not on file   Highest education level: Not on file  Occupational History   Not on file  Tobacco Use   Smoking status: Every Day    Current packs/day: 0.25    Average packs/day: 0.3 packs/day for 4.0 years (1.0 ttl pk-yrs)    Types: Cigars, Cigarettes   Smokeless tobacco: Never  Substance and Sexual Activity   Alcohol use: Yes    Alcohol/week: 2.0 standard drinks of alcohol    Types: 2 Glasses of wine per week   Drug use: Yes    Types: Marijuana   Sexual activity: Not Currently    Partners: Male    Birth control/protection: Condom  Other Topics Concern   Not on file  Social History Narrative   Not on file   Social Drivers of Health   Financial Resource Strain: Not on file  Food Insecurity: Not on file  Transportation Needs: Not on file  Physical Activity: Not on file  Stress: Not on file  Social Connections: Not on file  Intimate Partner Violence: Not on file    ROS: see HPI  Objective    BP (!) 156/100 (BP Location: Left Arm, Patient Position: Sitting, Cuff Size: Normal)   Pulse (!) 104   Temp 97.9 F (36.6 C) (Temporal)   Ht 5\' 3"  (1.6 m)   Wt 200 lb 6 oz (90.9 kg)   LMP 02/08/2023   SpO2 97%   BMI 35.49 kg/m   Physical Exam Vitals reviewed.  Constitutional:      General: She is not in acute distress.    Appearance: Normal appearance.  HENT:     Head: Normocephalic and atraumatic.  Neck:     Vascular: No carotid bruit.  Cardiovascular:     Rate and Rhythm: Normal rate and regular rhythm.     Pulses: Normal pulses.     Heart sounds: Normal heart sounds.  Pulmonary:     Effort: Pulmonary effort is normal.     Breath sounds: Normal breath sounds.  Musculoskeletal:     Right  shoulder: Normal.     Left shoulder: Normal.  Skin:    General: Skin is warm and dry.  Neurological:     General: No focal deficit present.     Mental Status: She is alert and oriented to person, place, and time.  Psychiatric:        Mood and Affect: Mood normal.        Behavior: Behavior normal.        Judgment: Judgment normal.         Assessment & Plan:   Problem List Items Addressed This Visit       Cardiovascular and Mediastinum   Essential hypertension - Primary   Chronic, uncontrolled Restart amlodipine 10 mg daily by mouth Follow-up in 1 month or sooner as needed.      Relevant Medications   amLODipine (NORVASC) 10 MG tablet   Other Relevant Orders   CBC   Comprehensive metabolic panel   Hemoglobin A1c   Lipid panel   TSH     Other   Class 2 severe obesity with serious comorbidity and body mass index (BMI) of 35.0 to 35.9 in adult [facility])   Labs ordered, further recommendations may be made based upon his results       Relevant Medications   amLODipine (NORVASC) 10 MG tablet   Other Relevant Orders   CBC   Comprehensive metabolic panel   Hemoglobin A1c   Lipid panel   TSH   Diabetes mellitus screening   Labs ordered, further recommendations may be made based upon his results       Relevant Medications   amLODipine (NORVASC) 10 MG tablet   Other Relevant Orders   CBC   Comprehensive metabolic panel   Hemoglobin A1c   Lipid panel   TSH   Encounter for lipid screening for cardiovascular disease   Labs ordered, further recommendations may be made based upon his results       Relevant Medications   amLODipine (NORVASC) 10 MG tablet   Other Relevant Orders   CBC   Comprehensive metabolic panel   Hemoglobin A1c   Lipid panel   TSH   Acute pain of right shoulder   Acute No red flags noted on exam Etiology unclear, we discussed x-ray but patient declined For now we will treat with Tylenol 500 to 1000 mg 3 times daily as needed. Patient to  follow-up in 1 month, if symptoms persist or worsen consider referral to sports medicine.      Relevant Medications   amLODipine (NORVASC) 10  MG tablet   Other Relevant Orders   CBC   Comprehensive metabolic panel   Hemoglobin A1c   Lipid panel   TSH   Bleeding nose   Acute Resolved Likely combination of hypertension that is been uncontrolled with dry weather over the winter.  Encouraged continued use of humidified air, consider saline nasal spray, and will start amlodipine to manage hypertension. Patient warned to not use Afrin more than 3 days in a row, she reports understanding.       Return in about 1 month (around 08/27/2023) for F/U with Maralyn Sago.   Elenore Paddy, NP

## 2023-07-28 NOTE — Assessment & Plan Note (Signed)
Acute Resolved Likely combination of hypertension that is been uncontrolled with dry weather over the winter.  Encouraged continued use of humidified air, consider saline nasal spray, and will start amlodipine to manage hypertension. Patient warned to not use Afrin more than 3 days in a row, she reports understanding.

## 2023-07-29 ENCOUNTER — Encounter: Payer: Self-pay | Admitting: Nurse Practitioner

## 2023-08-27 ENCOUNTER — Telehealth: Payer: BC Managed Care – PPO | Admitting: Nurse Practitioner

## 2023-08-27 DIAGNOSIS — I10 Essential (primary) hypertension: Secondary | ICD-10-CM

## 2023-08-27 DIAGNOSIS — D649 Anemia, unspecified: Secondary | ICD-10-CM | POA: Diagnosis not present

## 2023-08-27 DIAGNOSIS — Z1211 Encounter for screening for malignant neoplasm of colon: Secondary | ICD-10-CM | POA: Diagnosis not present

## 2023-08-27 NOTE — Progress Notes (Signed)
   Established Patient Office Visit  An audio/visual tele-health visit was completed today for this patient. I connected with  Janet Wise on 08/27/23 utilizing audio/visual technology and verified that I am speaking with the correct person using two identifiers. The patient was located at their parked car, and I was located at the office of [facility] at [facility] during the encounter. I discussed the limitations of evaluation and management by telemedicine. The patient expressed understanding and agreed to proceed.    Subjective   Patient ID: Janet Wise, female    DOB: 05/25/1976  Age: 48 y.o. MRN: [id number]  Chief Complaint  Patient presents with   Medical Management of Chronic Issues    1 Month Follow Up. Patient noted recently recovering from flu.    Patient has a virtual visit for the above.  She did start amlodipine 10 mg mouth daily since last office visit.  Unfortunately does not have at home blood pressure cuff so she does not have a way to check blood pressure and provide a reading today.  She reports that she is tolerating the amlodipine well.  Is not experiencing any more nosebleeds, no headache, no blurry vision, no dizziness, no chest pain, no shortness of breath.  Last labs identified stable anemia with a hemoglobin of 10.0.  Per chart review she has been anemic since at least 2017.  She was scheduled to undergo colonoscopy but due to uncontrolled hypertension this was canceled, has not had a colonoscopy before as far as I know. 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     Review of Systems  HENT:  Negative for nosebleeds.   Eyes:  Negative for blurred vision.  Respiratory:  Negative for shortness of breath.   Cardiovascular:  Negative for chest pain.  Neurological:  Negative for headaches.      Objective:     LMP 02/08/2023    Physical Exam Comprehensive physical exam not completed today as office visit was  conducted remotely.  Patient appears well over video, no signs of acute distress identified.  Patient was alert and oriented, and appeared to have appropriate judgment.   No results found for any visits on 08/27/23.    The 10-year ASCVD risk score (Arnett DK, et al., 2019) is: 7.5%    Assessment & Plan:   Problem List Items Addressed This Visit       Cardiovascular and Mediastinum   Essential hypertension   Chronic No blood pressure reading available to evaluate today but patient reports tolerating amlodipine well. Patient to follow-up in person in about 3 weeks for blood pressure reading.  Further recommendations may be made based upon these results.        Other   Colon cancer screening - Primary   Will rerefer to gastroenterology for patient to discuss undergoing screening colonoscopy.      Relevant Orders   Ambulatory referral to Gastroenterology   Anemia   Chronic, stable Consider additional labs at follow-up.       Return in about 3 weeks (around 09/17/2023) for F/U with Maralyn Sago.    Elenore Paddy, NP

## 2023-08-27 NOTE — Assessment & Plan Note (Signed)
 Chronic, stable Consider additional labs at follow-up.

## 2023-08-27 NOTE — Assessment & Plan Note (Signed)
 Chronic No blood pressure reading available to evaluate today but patient reports tolerating amlodipine well. Patient to follow-up in person in about 3 weeks for blood pressure reading.  Further recommendations may be made based upon these results.

## 2023-08-27 NOTE — Assessment & Plan Note (Signed)
 Will rerefer to gastroenterology for patient to discuss undergoing screening colonoscopy.

## 2023-09-17 ENCOUNTER — Ambulatory Visit (INDEPENDENT_AMBULATORY_CARE_PROVIDER_SITE_OTHER): Payer: BC Managed Care – PPO | Admitting: Nurse Practitioner

## 2023-09-17 ENCOUNTER — Encounter: Payer: Self-pay | Admitting: Nurse Practitioner

## 2023-09-17 ENCOUNTER — Ambulatory Visit

## 2023-09-17 VITALS — BP 142/94 | HR 93 | Temp 97.7°F | Ht 63.0 in | Wt 198.0 lb

## 2023-09-17 DIAGNOSIS — I10 Essential (primary) hypertension: Secondary | ICD-10-CM

## 2023-09-17 DIAGNOSIS — D649 Anemia, unspecified: Secondary | ICD-10-CM

## 2023-09-17 DIAGNOSIS — R053 Chronic cough: Secondary | ICD-10-CM | POA: Diagnosis not present

## 2023-09-17 DIAGNOSIS — R059 Cough, unspecified: Secondary | ICD-10-CM

## 2023-09-17 LAB — CBC
HCT: 32.6 % — ABNORMAL LOW (ref 36.0–46.0)
Hemoglobin: 10.2 g/dL — ABNORMAL LOW (ref 12.0–15.0)
MCHC: 31.3 g/dL (ref 30.0–36.0)
MCV: 81.4 fl (ref 78.0–100.0)
Platelets: 314 10*3/uL (ref 150.0–400.0)
RBC: 4 Mil/uL (ref 3.87–5.11)
RDW: 26.9 % — ABNORMAL HIGH (ref 11.5–15.5)
WBC: 5.5 10*3/uL (ref 4.0–10.5)

## 2023-09-17 LAB — FERRITIN: Ferritin: 27.1 ng/mL (ref 10.0–291.0)

## 2023-09-17 LAB — VITAMIN B12: Vitamin B-12: 109 pg/mL — ABNORMAL LOW (ref 211–911)

## 2023-09-17 LAB — IRON: Iron: 35 ug/dL — ABNORMAL LOW (ref 42–145)

## 2023-09-17 MED ORDER — PROMETHAZINE-DM 6.25-15 MG/5ML PO SYRP: 5.0000 mL | ORAL_SOLUTION | Freq: Four times a day (QID) | ORAL | 0 refills | Status: AC | PRN

## 2023-09-17 MED ORDER — PREDNISONE 20 MG PO TABS: 40.0000 mg | ORAL_TABLET | Freq: Every day | ORAL | 0 refills | Status: DC

## 2023-09-17 MED ORDER — ALBUTEROL SULFATE HFA 108 (90 BASE) MCG/ACT IN AERS: 2.0000 | INHALATION_SPRAY | Freq: Four times a day (QID) | RESPIRATORY_TRACT | 0 refills | Status: AC | PRN

## 2023-09-17 NOTE — Assessment & Plan Note (Signed)
 Chronic Blood pressure above goal, patient has not taken blood pressure medication yet today. She was encouraged to take her amlodipine daily and to follow-up in 1 month for blood pressure check.  She reports understanding.

## 2023-09-17 NOTE — Assessment & Plan Note (Addendum)
 Acute, oxygen saturation on RA 99% Treat with course of prednisone 40 mg daily x 5 days, albuterol inhaler as needed, and promethazine Dexameth or fan cough syrup as needed.  Patient educated to avoid driving or operating heavy machinery while taking cough syrup.  She reports her understanding.  Will send patient for stat chest x-ray, if evidence of pneumonia identified on x-ray will treat with course of antibiotics.

## 2023-09-17 NOTE — Addendum Note (Signed)
 Addended by: Elenore Paddy on: 09/17/2023 03:44 PM   Modules accepted: Orders

## 2023-09-17 NOTE — Assessment & Plan Note (Signed)
 Chronic Further labs ordered today, further conditions may be made based upon these results.

## 2023-09-17 NOTE — Progress Notes (Signed)
 Established Patient Office Visit  Subjective   Patient ID: Janet Wise, female    DOB: 12/31/1975  Age: 48 y.o. MRN: [id number]  Chief Complaint  Patient presents with   Cough    Cough: Has a flu about 1 month ago, has been coughing ever since.  Reports that cough remains productive and she also feels like she is wheezing.  No personal history of asthma.  Has tried Mucinex, Benadryl, NyQuil, DayQuil, Robitussin, Alka-Seltzer, Tylenol over-the-counter none of which have helped with her cough.  Hypertension: Was started on amlodipine 10 mg daily, reports she has not taken her dose today.  She was taking at bedtime but plans on trying to take it during the morning and was going to take next dose tomorrow.  She has not been able to undergo colon cancer screening as of yet because her blood pressure has not been well enough controlled.  No chest pain, headache, blurry vision.  Anemia: Patient reports last menstrual cycle was greater than 1 year ago, per chart review someone had documented dysfunctional uterine bleeding in the past but this is not occurring any longer.  She also was not seeing any blood in her stool.  Last hemoglobin was 10.     Review of Systems  Constitutional:  Negative for chills and fever.  HENT:  Negative for congestion.   Eyes:  Negative for blurred vision.  Respiratory:  Positive for sputum production and wheezing. Negative for cough and shortness of breath.   Cardiovascular:  Negative for chest pain.  Neurological:  Negative for dizziness.      Objective:     BP (!) 142/94 (BP Location: Left Arm, Patient Position: Sitting)   Pulse 93   Temp 97.7 F (36.5 C) (Temporal)   Ht 5\' 3"  (1.6 m)   Wt 198 lb (89.8 kg)   LMP 02/08/2023   SpO2 99%   BMI 35.07 kg/m  BP Readings from Last 3 Encounters:  09/17/23 (!) 142/94  07/28/23 (!) 156/100  06/30/23 (!) 117/94   Wt Readings from Last 3 Encounters:  09/17/23 198 lb (89.8 kg)  07/28/23 200 lb 6 oz (90.9  kg)  06/30/23 199 lb 15.3 oz (90.7 kg)      Physical Exam Vitals reviewed.  Constitutional:      General: She is not in acute distress.    Appearance: Normal appearance.  HENT:     Head: Normocephalic and atraumatic.  Neck:     Vascular: No carotid bruit.  Cardiovascular:     Rate and Rhythm: Normal rate and regular rhythm.     Pulses: Normal pulses.     Heart sounds: Normal heart sounds.  Pulmonary:     Effort: Pulmonary effort is normal.     Breath sounds: Wheezing and rhonchi present.  Skin:    General: Skin is warm and dry.  Neurological:     General: No focal deficit present.     Mental Status: She is alert and oriented to person, place, and time.  Psychiatric:        Mood and Affect: Mood normal.        Behavior: Behavior normal.        Judgment: Judgment normal.      No results found for any visits on 09/17/23.    The 10-year ASCVD risk score (Arnett DK, et al., 2019) is: 5.1%    Assessment & Plan:   Problem List Items Addressed This Visit       Cardiovascular and  Mediastinum   Essential hypertension   Chronic Blood pressure above goal, patient has not taken blood pressure medication yet today. She was encouraged to take her amlodipine daily and to follow-up in 1 month for blood pressure check.  She reports understanding.        Other   Anemia - Primary   Chronic Further labs ordered today, further conditions may be made based upon these results.      Relevant Orders   Iron   Ferritin   Hgb Fractionation Cascade   CBC   Vitamin B12   Pathologist smear review   Cough   Acute, oxygen saturation on RA 99% Treat with course of prednisone 40 mg daily x 5 days, albuterol inhaler as needed, and promethazine Dexameth or fan cough syrup as needed.  Patient educated to avoid driving or operating heavy machinery while taking cough syrup.  She reports her understanding.  Will send patient for stat chest x-ray, if evidence of pneumonia identified on  x-ray will treat with course of antibiotics.      Relevant Medications   predniSONE (DELTASONE) 20 MG tablet   albuterol (VENTOLIN HFA) 108 (90 Base) MCG/ACT inhaler   promethazine-dextromethorphan (PROMETHAZINE-DM) 6.25-15 MG/5ML syrup   Other Relevant Orders   DG Chest 2 View    Return in about 1 month (around 10/18/2023) for F/U with Emmarae Cowdery.    Elenore Paddy, NP

## 2023-09-20 LAB — EXTRA LAV TOP TUBE

## 2023-09-20 LAB — PERIPHERAL BLOOD SMEAR REVIEW

## 2023-09-22 LAB — HGB FRACTIONATION CASCADE
Hgb A2: 2.1 % (ref 1.8–3.2)
Hgb A: 97.9 % (ref 96.4–98.8)
Hgb F: 0 % (ref 0.0–2.0)
Hgb S: 0 %

## 2023-10-22 ENCOUNTER — Ambulatory Visit: Admitting: Nurse Practitioner

## 2023-10-29 ENCOUNTER — Ambulatory Visit (INDEPENDENT_AMBULATORY_CARE_PROVIDER_SITE_OTHER): Admitting: Nurse Practitioner

## 2023-10-29 VITALS — BP 136/84 | HR 81 | Temp 98.2°F | Ht 63.0 in | Wt 206.0 lb

## 2023-10-29 DIAGNOSIS — D649 Anemia, unspecified: Secondary | ICD-10-CM | POA: Diagnosis not present

## 2023-10-29 DIAGNOSIS — E538 Deficiency of other specified B group vitamins: Secondary | ICD-10-CM | POA: Diagnosis not present

## 2023-10-29 DIAGNOSIS — I10 Essential (primary) hypertension: Secondary | ICD-10-CM | POA: Diagnosis not present

## 2023-10-29 LAB — VITAMIN B12: Vitamin B-12: 565 pg/mL (ref 211–911)

## 2023-10-29 LAB — BASIC METABOLIC PANEL WITH GFR
BUN: 14 mg/dL (ref 6–23)
CO2: 27 meq/L (ref 19–32)
Calcium: 9.3 mg/dL (ref 8.4–10.5)
Chloride: 103 meq/L (ref 96–112)
Creatinine, Ser: 0.73 mg/dL (ref 0.40–1.20)
GFR: 96.34 mL/min (ref >60.00)
Glucose, Bld: 95 mg/dL (ref 70–99)
Potassium: 3.9 meq/L (ref 3.5–5.1)
Sodium: 140 meq/L (ref 135–145)

## 2023-10-29 LAB — FERRITIN: Ferritin: 32.6 ng/mL (ref 10.0–291.0)

## 2023-10-29 LAB — IRON: Iron: 27 ug/dL — ABNORMAL LOW (ref 42–145)

## 2023-10-29 NOTE — Assessment & Plan Note (Signed)
 Chronic Blood pressure much improved today however still slightly above goal. Shared decision making conversation completed today regarding focusing on lifestyle modification aimed at lowering blood pressure (DASH diet) versus adding additional agent.  Patient has elected to try the DASH diet as opposed to adding additional agent. Handout provided.

## 2023-10-29 NOTE — Assessment & Plan Note (Signed)
 Chronic Continue ferrous sulfate 325 mg by mouth once daily Patient given contact information back to  GI so she can contact them to see if she get her colonoscopy rescheduled.  We did discuss that colon cancer can be a cause of iron deficiency especially in the absence of her having menstrual cycles and I emphasized the importance of getting this colonoscopy completed.  She reports understanding and states she will call them.

## 2023-10-29 NOTE — Progress Notes (Signed)
 Established Patient Office Visit  Subjective   Patient ID: Janet Wise, female    DOB: 09-25-1975  Age: 48 y.o. MRN: [id number]  Chief Complaint  Patient presents with   Hypertension    Hypertension: Patient has today for follow-up of blood pressure.  She did take her amlodipine  today.  She is trying to get blood pressure under control so she can undergo colonoscopy.  Vitamin B12 deficiency/iron deficiency: Last labs identified B12 level of 109 with evidence of iron deficiency anemia.  Iron level 35, hemoglobin 10.2.  Since then she has been taking a vitamin B12 supplement 1000 mcg by mouth daily.  She is also been taking ferrous sulfate 325 mg by mouth once a day.  She has had some upset stomach but not with every dose so it is unclear if this is a side effect from the medication.  Denies any constipation.    Review of Systems  Gastrointestinal:  Negative for abdominal pain and constipation.      Objective:     BP 136/84   Pulse 81   Temp 98.2 F (36.8 C) (Temporal)   Ht 5\' 3"  (1.6 m)   Wt 206 lb (93.4 kg)   LMP 02/08/2023   SpO2 97%   BMI 36.49 kg/m  BP Readings from Last 3 Encounters:  10/29/23 136/84  09/17/23 (!) 142/94  07/28/23 (!) 156/100   Wt Readings from Last 3 Encounters:  10/29/23 206 lb (93.4 kg)  09/17/23 198 lb (89.8 kg)  07/28/23 200 lb 6 oz (90.9 kg)      Physical Exam Vitals reviewed.  Constitutional:      General: She is not in acute distress.    Appearance: Normal appearance.  HENT:     Head: Normocephalic and atraumatic.  Neck:     Vascular: No carotid bruit.  Cardiovascular:     Rate and Rhythm: Normal rate and regular rhythm.     Pulses: Normal pulses.     Heart sounds: Normal heart sounds.  Pulmonary:     Effort: Pulmonary effort is normal.     Breath sounds: Normal breath sounds.  Skin:    General: Skin is warm and dry.  Neurological:     General: No focal deficit present.     Mental Status: She is alert and oriented to  person, place, and time.  Psychiatric:        Mood and Affect: Mood normal.        Behavior: Behavior normal.        Judgment: Judgment normal.      No results found for any visits on 10/29/23.    The 10-year ASCVD risk score (Arnett DK, et al., 2019) is: 4.2%    Assessment & Plan:   Problem List Items Addressed This Visit       Cardiovascular and Mediastinum   Essential hypertension - Primary   Chronic Blood pressure much improved today however still slightly above goal. Shared decision making conversation completed today regarding focusing on lifestyle modification aimed at lowering blood pressure (DASH diet) versus adding additional agent.  Patient has elected to try the DASH diet as opposed to adding additional agent. Handout provided.      Relevant Orders   Vitamin B12   Intrinsic Factor Antibodies   Iron   Ferritin   CBC   Basic metabolic panel with GFR     Other   Anemia   Chronic Continue ferrous sulfate 325 mg by mouth once daily Patient given  contact information back to [facility] so she can contact them to see if she get her colonoscopy rescheduled.  We did discuss that colon cancer can be a cause of iron deficiency especially in the absence of her having menstrual cycles and I emphasized the importance of getting this colonoscopy completed.  She reports understanding and states she will call them.      Relevant Medications   cyanocobalamin  (VITAMIN B12) 1000 MCG tablet   ferrous sulfate 325 (65 FE) MG EC tablet   Other Relevant Orders   Vitamin B12   Intrinsic Factor Antibodies   Iron   Ferritin   CBC   B12 deficiency   Chronic Continue vitamin B12 1000 mcg a mouth daily. Check intrinsic factor antibodies and B12 level today, further recommendations may be made based upon his results.      Relevant Orders   Vitamin B12   Intrinsic Factor Antibodies   Iron   Ferritin   CBC    Return in about 6 months (around 04/30/2024) for CPE with Isa Manuel.     Zorita Hiss, NP

## 2023-10-29 NOTE — Assessment & Plan Note (Signed)
 Chronic Continue vitamin B12 1000 mcg a mouth daily. Check intrinsic factor antibodies and B12 level today, further recommendations may be made based upon his results.

## 2023-10-29 NOTE — Patient Instructions (Signed)
  GI - call them for colonoscopy/evaluation of anemia [phone] option 1

## 2023-10-31 LAB — INTRINSIC FACTOR ANTIBODIES: Intrinsic Factor: NEGATIVE

## 2023-10-31 LAB — CBC
HCT: 34.8 % — ABNORMAL LOW (ref 36.0–46.0)
Hemoglobin: 10.6 g/dL — ABNORMAL LOW (ref 12.0–15.0)
MCHC: 30.5 g/dL (ref 30.0–36.0)
MCV: 87.1 fl (ref 78.0–100.0)
Platelets: 364 10*3/uL (ref 150.0–400.0)
RBC: 4 Mil/uL (ref 3.87–5.11)
RDW: 30.5 % — ABNORMAL HIGH (ref 11.5–15.5)
WBC: 5.2 10*3/uL (ref 4.0–10.5)

## 2023-11-01 ENCOUNTER — Encounter: Payer: Self-pay | Admitting: Nurse Practitioner

## 2024-02-07 DIAGNOSIS — Z1231 Encounter for screening mammogram for malignant neoplasm of breast: Secondary | ICD-10-CM | POA: Diagnosis not present

## 2024-02-14 DIAGNOSIS — Z01419 Encounter for gynecological examination (general) (routine) without abnormal findings: Secondary | ICD-10-CM | POA: Diagnosis not present

## 2024-02-14 DIAGNOSIS — I10 Essential (primary) hypertension: Secondary | ICD-10-CM | POA: Diagnosis not present

## 2024-02-14 DIAGNOSIS — R87810 Cervical high risk human papillomavirus (HPV) DNA test positive: Secondary | ICD-10-CM | POA: Diagnosis not present

## 2024-02-14 DIAGNOSIS — N951 Menopausal and female climacteric states: Secondary | ICD-10-CM | POA: Diagnosis not present

## 2024-05-04 ENCOUNTER — Encounter: Admitting: Nurse Practitioner

## 2024-05-19 DIAGNOSIS — R87810 Cervical high risk human papillomavirus (HPV) DNA test positive: Secondary | ICD-10-CM | POA: Diagnosis not present

## 2024-07-20 ENCOUNTER — Ambulatory Visit: Admitting: Nurse Practitioner

## 2024-07-20 DIAGNOSIS — Z131 Encounter for screening for diabetes mellitus: Secondary | ICD-10-CM

## 2024-07-20 DIAGNOSIS — Z13220 Encounter for screening for lipoid disorders: Secondary | ICD-10-CM | POA: Diagnosis not present

## 2024-07-20 DIAGNOSIS — E66812 Obesity, class 2: Secondary | ICD-10-CM | POA: Diagnosis not present

## 2024-07-20 DIAGNOSIS — Z0000 Encounter for general adult medical examination without abnormal findings: Secondary | ICD-10-CM | POA: Diagnosis not present

## 2024-07-20 DIAGNOSIS — I10 Essential (primary) hypertension: Secondary | ICD-10-CM

## 2024-07-20 DIAGNOSIS — Z136 Encounter for screening for cardiovascular disorders: Secondary | ICD-10-CM

## 2024-07-20 DIAGNOSIS — Z6835 Body mass index (BMI) 35.0-35.9, adult: Secondary | ICD-10-CM

## 2024-07-20 LAB — CBC WITH DIFFERENTIAL/PLATELET
Basophils Absolute: 0 K/uL (ref 0.0–0.1)
Basophils Relative: 0.7 % (ref 0.0–3.0)
Eosinophils Absolute: 0.2 K/uL (ref 0.0–0.7)
Eosinophils Relative: 3.5 % (ref 0.0–5.0)
HCT: 36.7 % (ref 36.0–46.0)
Hemoglobin: 12 g/dL (ref 12.0–15.0)
Lymphocytes Relative: 40.9 % (ref 12.0–46.0)
Lymphs Abs: 1.8 K/uL (ref 0.7–4.0)
MCHC: 32.7 g/dL (ref 30.0–36.0)
MCV: 95.1 fl (ref 78.0–100.0)
Monocytes Absolute: 0.6 K/uL (ref 0.1–1.0)
Monocytes Relative: 13.4 % — ABNORMAL HIGH (ref 3.0–12.0)
Neutro Abs: 1.9 K/uL (ref 1.4–7.7)
Neutrophils Relative %: 41.5 % — ABNORMAL LOW (ref 43.0–77.0)
Platelets: 355 K/uL (ref 150.0–400.0)
RBC: 3.85 Mil/uL — ABNORMAL LOW (ref 3.87–5.11)
RDW: 17.2 % — ABNORMAL HIGH (ref 11.5–15.5)
WBC: 4.5 K/uL (ref 4.0–10.5)

## 2024-07-20 LAB — LIPID PANEL
Cholesterol: 155 mg/dL (ref 28–200)
HDL: 57.8 mg/dL
LDL Cholesterol: 83 mg/dL (ref 10–99)
NonHDL: 97.23
Total CHOL/HDL Ratio: 3
Triglycerides: 72 mg/dL (ref 10.0–149.0)
VLDL: 14.4 mg/dL (ref 0.0–40.0)

## 2024-07-20 LAB — COMPREHENSIVE METABOLIC PANEL WITH GFR
ALT: 7 U/L (ref 3–35)
AST: 15 U/L (ref 5–37)
Albumin: 4.2 g/dL (ref 3.5–5.2)
Alkaline Phosphatase: 63 U/L (ref 39–117)
BUN: 16 mg/dL (ref 6–23)
CO2: 27 meq/L (ref 19–32)
Calcium: 9.6 mg/dL (ref 8.4–10.5)
Chloride: 102 meq/L (ref 96–112)
Creatinine, Ser: 0.54 mg/dL (ref 0.40–1.20)
GFR: 107.31 mL/min
Glucose, Bld: 88 mg/dL (ref 70–99)
Potassium: 3.8 meq/L (ref 3.5–5.1)
Sodium: 138 meq/L (ref 135–145)
Total Bilirubin: 0.2 mg/dL (ref 0.2–1.2)
Total Protein: 8.2 g/dL (ref 6.0–8.3)

## 2024-07-20 LAB — HEMOGLOBIN A1C: Hgb A1c MFr Bld: 4.9 % (ref 4.6–6.5)

## 2024-07-20 LAB — TSH: TSH: 0.78 u[IU]/mL (ref 0.35–5.50)

## 2024-07-20 MED ORDER — AMLODIPINE BESYLATE 5 MG PO TABS: 5.0000 mg | ORAL_TABLET | Freq: Every day | ORAL | 3 refills | Status: AC

## 2024-07-20 NOTE — Progress Notes (Signed)
 "  Complete physical exam  Patient: Janet Wise   DOB: 09-27-1975   48 y.o. Female  MRN: [id number]  Subjective:    Chief Complaint  Patient presents with   Annual Exam    Physical with no breast exam    Janet Wise is a 49 y.o. female who presents today for a complete physical exam.   Discussed the use of AI scribe software for clinical note transcription with the patient, who gave verbal consent to proceed.  History of Present Illness Janet Wise is a 49 year old female with hypertension who presents for annual exam and follow-up for hypertension.   Hypertension management - Has been out of amlodipine  since December (has not taken any BP meds) - Perceives improved blood pressure control with recent dietary changes - No chest pain, headaches, blurry vision, lightheadedness, or palpitations except with heavy coffee intake - Desires blood pressure control to proceed with colonoscopy, which was previously deferred due to elevated blood pressure   Health Maintenance: - Reports that she is up-to-date with mammogram and pap smear with her OBGYN. We do not have these records. Reports no abnormal findings.  - Due for colonoscopy - Due for flu, pneumonia, covid, and shingles vaccines    Most recent fall risk assessment:    07/28/2023    2:06 PM  Fall Risk   Falls in the past year? 0  Number falls in past yr: 0  Injury with Fall? 0   Risk for fall due to : No Fall Risks  Follow up Falls evaluation completed     Data saved with a previous flowsheet row definition     Most recent depression screenings:    07/28/2023    2:07 PM 02/16/2016   11:47 AM  PHQ 2/9 Scores  PHQ - 2 Score 0 0      Past Medical History:  Diagnosis Date   Hypertension 2012   Past Surgical History:  Procedure Laterality Date   WISDOM TOOTH EXTRACTION Bilateral    Social History   Socioeconomic History   Marital status: Divorced    Spouse name: Not on file   Number of children:  Not on file   Years of education: Not on file   Highest education level: Not on file  Occupational History   Not on file  Tobacco Use   Smoking status: Every Day    Current packs/day: 0.25    Average packs/day: 0.3 packs/day for 4.0 years (1.0 ttl pk-yrs)    Types: Cigars, Cigarettes   Smokeless tobacco: Never  Substance and Sexual Activity   Alcohol use: Yes    Alcohol/week: 2.0 standard drinks of alcohol    Types: 2 Glasses of wine per week   Drug use: Yes    Types: Marijuana   Sexual activity: Not Currently    Partners: Male    Birth control/protection: Condom  Other Topics Concern   Not on file  Social History Narrative   Not on file   Social Drivers of Health   Tobacco Use: High Risk (07/28/2023)   Patient History    Smoking Tobacco Use: Every Day    Smokeless Tobacco Use: Never    Passive Exposure: Not on file  Financial Resource Strain: Not on file  Food Insecurity: Not on file  Transportation Needs: Not on file  Physical Activity: Not on file  Stress: Not on file  Social Connections: Not on file  Intimate Partner Violence: Not on file  Depression (PHQ2-9): Low Risk (07/28/2023)  Depression (PHQ2-9)    PHQ-2 Score: 0  Alcohol Screen: Not on file  Housing: Not on file  Utilities: Not on file  Health Literacy: Not on file   Family History  Problem Relation Age of Onset   Heart attack Father    Diabetes Maternal Grandmother    Hypertension Maternal Grandmother    Diabetes Paternal Grandmother    Hypertension Paternal Grandmother    Allergies[1]    Patient Care Team: Janet Janet BRAVO, NP as PCP - General (Nurse Practitioner)   Show/hide medication list[2]  Review of Systems  Constitutional:  Negative for diaphoresis and weight loss.  Eyes:  Negative for blurred vision.  Respiratory:  Negative for cough and shortness of breath.   Cardiovascular:  Negative for chest pain and palpitations.  Gastrointestinal:  Negative for abdominal pain and blood in  stool.  Neurological:  Negative for dizziness and headaches.  Psychiatric/Behavioral:  Negative for depression and suicidal ideas. The patient is not nervous/anxious.           Objective:     BP 132/84   Pulse 84   Temp 98.2 F (36.8 C) (Temporal)   Ht 5' 3 (1.6 m)   Wt 210 lb 4 oz (95.4 kg)   LMP 02/08/2023   SpO2 98%   BMI 37.24 kg/m  BP Readings from Last 3 Encounters:  07/20/24 132/84  10/29/23 136/84  09/17/23 (!) 142/94   Wt Readings from Last 3 Encounters:  07/20/24 210 lb 4 oz (95.4 kg)  10/29/23 206 lb (93.4 kg)  09/17/23 198 lb (89.8 kg)      Physical Exam Vitals reviewed.  Constitutional:      Appearance: Normal appearance.  HENT:     Head: Normocephalic and atraumatic.     Right Ear: Tympanic membrane, ear canal and external ear normal.     Left Ear: Tympanic membrane, ear canal and external ear normal.  Eyes:     General:        Right eye: No discharge.        Left eye: No discharge.     Extraocular Movements: Extraocular movements intact.     Conjunctiva/sclera: Conjunctivae normal.     Pupils: Pupils are equal, round, and reactive to light.  Neck:     Vascular: No carotid bruit.  Cardiovascular:     Rate and Rhythm: Normal rate and regular rhythm.     Pulses: Normal pulses.     Heart sounds: Normal heart sounds. No murmur heard. Pulmonary:     Effort: Pulmonary effort is normal.     Breath sounds: Normal breath sounds.  Abdominal:     General: Abdomen is flat. Bowel sounds are normal. There is no distension.     Palpations: Abdomen is soft. There is no mass.     Tenderness: There is no abdominal tenderness.  Musculoskeletal:        General: No tenderness.     Cervical back: Neck supple. No muscular tenderness.     Right lower leg: No edema.     Left lower leg: No edema.  Lymphadenopathy:     Cervical: No cervical adenopathy.     Upper Body:     Right upper body: No supraclavicular adenopathy.     Left upper body: No  supraclavicular adenopathy.  Skin:    General: Skin is warm and dry.  Neurological:     General: No focal deficit present.     Mental Status: She is alert and oriented to  person, place, and time.     Motor: No weakness.     Gait: Gait normal.  Psychiatric:        Mood and Affect: Mood normal.        Behavior: Behavior normal.        Judgment: Judgment normal.      No results found for any visits on 07/20/24.     Assessment & Plan:    Routine Health Maintenance and Physical Exam  Immunization History  Administered Date(s) Administered   Tdap 06/24/2022    Health Maintenance  Topic Date Due   Hepatitis C Screening  Never done   Hepatitis B Vaccines 19-59 Average Risk (1 of 3 - 19+ 3-dose series) Never done   Cervical Cancer Screening (HPV/Pap Cotest)  12/26/2018   Colonoscopy  Never done   COVID-19 Vaccine (1 - 2025-26 season) Never done   Influenza Vaccine  09/26/2024 (Originally 01/28/2024)   Zoster Vaccines- Shingrix (1 of 2) 10/18/2024 (Originally 01/28/2024)   Pneumococcal Vaccine: 50+ Years (1 of 2 - PCV) 07/20/2025 (Originally 01/27/1993)   Mammogram  11/27/2025   DTaP/Tdap/Td (2 - Td or Tdap) 06/24/2032   HPV VACCINES (No Doses Required) Completed   HIV Screening  Completed   Meningococcal B Vaccine  Aged Out    Discussed health benefits of physical activity, and encouraged her to engage in regular exercise appropriate for her age and condition.  Problem List Items Addressed This Visit       Cardiovascular and Mediastinum   Essential hypertension   Essential hypertension Blood pressure improved without medication but not optimal. Discussed potential need for amlodipine  if systolic BP exceeds 130 mmHg. Emphasized importance of BP control for colonoscopy scheduling. - Prescribed amlodipine  5 mg tablets. - Monitor blood pressure daily for 1-2 weeks. - Start amlodipine  5 mg if systolic BP > 130 mmHg. - Do not start amlodipine  if systolic BP < 130 mmHg. - Patient  would like to hold off on colonoscopy. She was encouraged to have this done as soon as possible and to notify when ready for colonoscopy referral. She reports understanding.       Relevant Medications   amLODipine  (NORVASC ) 5 MG tablet   Other Relevant Orders   CBC with Differential/Platelet   Comprehensive metabolic panel with GFR   Lipid panel   TSH   Hemoglobin A1c     Other   Class 2 severe obesity with serious comorbidity and body mass index (BMI) of 35.0 to 35.9 in adult   Chronic, Stable Labs ordered, further recommendations may be made based upon his results       Relevant Orders   CBC with Differential/Platelet   Comprehensive metabolic panel with GFR   Lipid panel   TSH   Hemoglobin A1c   Diabetes mellitus screening   Labs ordered, further recommendations may be made based upon his results       Relevant Orders   CBC with Differential/Platelet   Comprehensive metabolic panel with GFR   Lipid panel   TSH   Hemoglobin A1c   Encounter for lipid screening for cardiovascular disease   Discussed healthy lifestyle Discussed recommended screenings Discussed vaccinations, patient declines all vaccines today      Relevant Orders   CBC with Differential/Platelet   Comprehensive metabolic panel with GFR   Lipid panel   TSH   Hemoglobin A1c    Assessment and Plan Assessment & Plan Essential hypertension Blood pressure improved without medication but not optimal. Discussed potential  need for amlodipine  if systolic BP exceeds 130 mmHg. Emphasized importance of BP control for colonoscopy scheduling. - Prescribed amlodipine  5 mg tablets. - Monitor blood pressure daily for 1-2 weeks. - Start amlodipine  5 mg if systolic BP > 130 mmHg. - Do not start amlodipine  if systolic BP < 130 mmHg. - Patient would like to hold off on colonoscopy. She was encouraged to have this done as soon as possible and to notify when ready for colonoscopy referral. She reports understanding.    In addition to annual exam, I completed and office visit as detailed above  Return in about 3 months (around 10/18/2024) for F/U with Janet.       Janet FORBES Pereyra, NP      [1] No Known Allergies [2]  Outpatient Medications Prior to Visit  Medication Sig   albuterol  (VENTOLIN  HFA) 108 (90 Base) MCG/ACT inhaler Inhale 2 puffs into the lungs every 6 (six) hours as needed for wheezing or shortness of breath.   cyanocobalamin  (VITAMIN B12) 1000 MCG tablet Take 1,000 mcg by mouth daily.   ferrous sulfate 325 (65 FE) MG EC tablet Take 325 mg by mouth daily.   oxymetazoline  (AFRIN) 0.05 % nasal spray Place 1 spray into both nostrils 2 (two) times daily.   promethazine -dextromethorphan (PROMETHAZINE -DM) 6.25-15 MG/5ML syrup Take 5 mLs by mouth 4 (four) times daily as needed for cough.   [DISCONTINUED] amLODipine  (NORVASC ) 10 MG tablet Take 1 tablet (10 mg total) by mouth daily.   No facility-administered medications prior to visit.   "

## 2024-07-20 NOTE — Assessment & Plan Note (Signed)
 Discussed healthy lifestyle Discussed recommended screenings Discussed vaccinations, patient declines all vaccines today

## 2024-07-20 NOTE — Assessment & Plan Note (Signed)
 Chronic, Stable Labs ordered, further recommendations may be made based upon his results

## 2024-07-20 NOTE — Assessment & Plan Note (Signed)
 Essential hypertension Blood pressure improved without medication but not optimal. Discussed potential need for amlodipine  if systolic BP exceeds 130 mmHg. Emphasized importance of BP control for colonoscopy scheduling. - Prescribed amlodipine  5 mg tablets. - Monitor blood pressure daily for 1-2 weeks. - Start amlodipine  5 mg if systolic BP > 130 mmHg. - Do not start amlodipine  if systolic BP < 130 mmHg. - Patient would like to hold off on colonoscopy. She was encouraged to have this done as soon as possible and to notify when ready for colonoscopy referral. She reports understanding.

## 2024-07-20 NOTE — Assessment & Plan Note (Signed)
 Labs ordered, further recommendations may be made based upon his results.

## 2024-07-20 NOTE — Patient Instructions (Addendum)
 Check BP daily. Goal BP is 100-130/75-80. As long as you are at goal for 1 week please reach out to me.

## 2024-07-21 ENCOUNTER — Ambulatory Visit: Payer: Self-pay | Admitting: Nurse Practitioner

## 2024-10-20 ENCOUNTER — Ambulatory Visit: Admitting: Nurse Practitioner
